# Patient Record
Sex: Female | Born: 1995 | Race: White | Hispanic: No | Smoking: Never smoker
Health system: Southern US, Community
[De-identification: ages and names within clinical notes are randomized; demographics above are authoritative.]

## PROBLEM LIST (undated history)

## (undated) DIAGNOSIS — N289 Disorder of kidney and ureter, unspecified: Secondary | ICD-10-CM

## (undated) HISTORY — DX: Disorder of kidney and ureter, unspecified: N28.9

---
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
## 2015-10-01 ENCOUNTER — Encounter: Payer: Self-pay | Admitting: Obstetrics and Gynecology

## 2015-10-01 ENCOUNTER — Ambulatory Visit (INDEPENDENT_AMBULATORY_CARE_PROVIDER_SITE_OTHER): Payer: Managed Care, Other (non HMO) | Admitting: Obstetrics and Gynecology

## 2015-10-01 VITALS — BP 124/80 | HR 82 | Ht 64.0 in | Wt 128.0 lb

## 2015-10-01 DIAGNOSIS — N94819 Vulvodynia, unspecified: Secondary | ICD-10-CM | POA: Diagnosis not present

## 2015-10-01 DIAGNOSIS — N941 Unspecified dyspareunia: Secondary | ICD-10-CM | POA: Diagnosis not present

## 2015-10-01 MED ORDER — LIDOCAINE 5 % EX OINT: TOPICAL_OINTMENT | CUTANEOUS | Status: DC

## 2015-10-01 NOTE — Progress Notes (Signed)
20 y.o. G0P0000 SingleCaucasianF here for a consultation from Valora Piccolo, PA at [facility] for painful vaginal lesions x 3-4 weeks. She was treated with steroid ointment for a week, no help. Initially was hurting when she wiped. She was tested for HSV, negative culture. Only currently tender to touch. Prior provider told her it didn't look like HSV.  She is sexually active, but not since this started. Partner without symptoms. 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 She isn't using a lubricant. The patient was checked for STD's and had an annual exam in 12/16. Lab work was negative.  No fevers, no eye or mouth ulcerations.  Period Cycle (Days): 30 Period Duration (Days): 5 Period Pattern: Regular Menstrual Flow: Moderate, Light Menstrual Control: Maxi pad Menstrual Control Change Freq (Hours): 4 to 5 Dysmenorrhea: (!) Moderate Dysmenorrhea Symptoms: Cramping  Patient's last menstrual period was 09/16/2015.          Sexually active: Yes.    The current method of family planning is OCP (estrogen/progesterone).    Exercising: Yes.    Gym/ health club routine includes cardio. Smoker:  no  Health Maintenance: Pap:  07/10/2015 History of abnormal Pap:  no MMG:  N/a  Colonoscopy:  n/a TDaP:  No sure of last date  Gardasil: completed series in 2015   reports that she has never smoked. She does not have any smokeless tobacco history on file. She reports that she does not drink alcohol or use illicit drugs.  Past Medical History  Diagnosis Date  . Kidney disease 2001    Past Surgical History  Procedure Laterality Date  . Tonsillectomy  2004  . Wisdom tooth extraction  2011  . Birth mark removal  2000    Current Outpatient Prescriptions  Medication Sig Dispense Refill  . enalapril (VASOTEC) 20 MG tablet Take 20 mg by mouth daily.    . norethindrone-ethinyl estradiol (MICROGESTIN) 1-20 MG-MCG tablet Take 1 tablet by mouth daily.     No  current facility-administered medications for this visit.    Family History  Problem Relation Age of Onset  . Hypertension Father   . Diabetes Father     Review of Systems  Exam:   BP 124/80 mmHg  Pulse 82  Ht  (1.626 m)  Wt 128 lb (58.06 kg)  BMI 21.96 kg/m2  LMP 09/16/2015  Weight change: @ Height:   Height:  (162.6 cm)  Ht Readings from Last 3 Encounters:  10/01/15  (1.626 m)    General appearance: alert, cooperative and appears stated age   Pelvic: External genitalia:  no lesions, inside of the labia minora, just distal to the vestibule, she has multiple vulvar papillae (normal anatomy). She is very tender to palpation of the vestibule in all 4 quadrants.               Urethra:  normal appearing urethra with no masses, tenderness or lesions              Bartholins and Skenes: normal                 Vagina: normal appearing vagina with normal color and discharge, no lesions              Cervix: no lesions               Bimanual Exam:  Uterus:  normal size, contour, position, consistency, mobility, non-tender  Adnexa: no mass, fullness, tenderness  Pelvic floor: not tender                Chaperone was present for exam.  Wet prep: no clue, no trich, + wbc KOH: no yeast PH: 4   A:  Vulvodynia  Dyspareunia  P:   Affirm sent  Information on vulvar pain given (Up To Date)  Discussed vulvar skin care  Discussed avoiding caffeine, chocolate, acidic food  Will call in compounded cream to apply to the vestibule  Lidocaine ointment to use prior to intercourse  Use a lubricant with intercourse, patient to control rate and depth of penetration  F/U in 1 month    CC: Valora PiccoloMatthew Strupp, PA Letter sent

## 2015-10-02 LAB — WET PREP BY MOLECULAR PROBE
CANDIDA SPECIES: NEGATIVE
GARDNERELLA VAGINALIS: NEGATIVE
TRICHOMONAS VAG: NEGATIVE

## 2015-10-02 MED ORDER — NONFORMULARY OR COMPOUNDED ITEM: Status: DC

## 2015-10-06 ENCOUNTER — Telehealth: Payer: Self-pay

## 2015-10-06 MED ORDER — NONFORMULARY OR COMPOUNDED ITEM: Status: DC

## 2015-10-06 NOTE — Telephone Encounter (Signed)
Spoke with patient. Advised [facility] that she has been experiencing burning shortly after using the compounded cream which contains Gabapentin 6%, Ketoprofen 10% and Ketamine 10% in a neutral base. Advised I have reviewed this with Dr.Jertson and she would like to send in a new rx for Gabapentin 6%, Ketoprofen 10 % in a neutral base apply a pea sized amount TID. Advised this will remove the Ketamine which may be causing the burning. Requested that the pharmacy only fill a small amount so that she may try this new rx and see how she does. She is agreeable and will provide an update on how she is doing after she has used the cream for 2 days.  Dr.Jerston, do you agree with recommendations for this patient?

## 2015-10-07 NOTE — Telephone Encounter (Signed)
Yes, thank you.

## 2015-11-05 ENCOUNTER — Ambulatory Visit (INDEPENDENT_AMBULATORY_CARE_PROVIDER_SITE_OTHER): Payer: Managed Care, Other (non HMO) | Admitting: Obstetrics and Gynecology

## 2015-11-05 ENCOUNTER — Encounter: Payer: Self-pay | Admitting: Obstetrics and Gynecology

## 2015-11-05 VITALS — BP 110/60 | HR 60 | Resp 14 | Wt 123.0 lb

## 2015-11-05 DIAGNOSIS — N94819 Vulvodynia, unspecified: Secondary | ICD-10-CM | POA: Diagnosis not present

## 2015-11-05 NOTE — Progress Notes (Signed)
Patient ID: Danielle Ponce, female   DOB: [birth year], 20 y.o.   MRN: [id number] GYNECOLOGY  VISIT   HPI: 20 y.o.   Single  Caucasian  female   G0P0000 with Patient's last menstrual period was 10/15/2015.   here for follow up of Vulvodynia /Dyspareunia. Last month she was started on a topical compounded cream, using it 3 x a day. She is feeling better. No longer tender to touch. She hasn't been sexually active since her last visit.   GYNECOLOGIC HISTORY: Patient's last menstrual period was 10/15/2015. Contraception:OCP Menopausal hormone therapy: none         OB History    Gravida Para Term Preterm AB TAB SAB Ectopic Multiple Living           There are no active problems to display for this patient.   Past Medical History  Diagnosis Date  . Kidney disease 2001    Past Surgical History  Procedure Laterality Date  . Tonsillectomy  2004  . Wisdom tooth extraction  2011  . Birth mark removal  2000    Current Outpatient Prescriptions  Medication Sig Dispense Refill  . enalapril (VASOTEC) 20 MG tablet Take 20 mg by mouth daily.    Marland Kitchen lidocaine (XYLOCAINE) 5 % ointment Apply a small amount topically 20 minutes prior to intercourse, wipe off just prior to intercourse. 30 g 1  . NONFORMULARY OR COMPOUNDED ITEM Gabapentin 6%, ketoprofen 10% in a neutral base. Apply a pea sized amount topically TID. 1 each 1  . norethindrone-ethinyl estradiol (MICROGESTIN) 1-20 MG-MCG tablet Take 1 tablet by mouth daily.     No current facility-administered medications for this visit.     ALLERGIES: Review of patient's allergies indicates no known allergies.  Family History  Problem Relation Age of Onset  . Hypertension Father   . Diabetes Father     Social History   Social History  . Marital Status: Single    Spouse Name: N/A  . Number of Children: N/A  . Years of Education: N/A   Occupational History  . Not on file.   Social History Main Topics  . Smoking  status: Never Smoker   . Smokeless tobacco: Never Used  . Alcohol Use: No  . Drug Use: No  . Sexual Activity:    Partners: Male   Other Topics Concern  . Not on file   Social History Narrative    Review of Systems  Constitutional: Negative.   HENT: Negative.   Eyes: Negative.   Respiratory: Negative.   Cardiovascular: Negative.   Gastrointestinal: Negative.   Genitourinary: Negative.   Musculoskeletal: Negative.   Skin: Negative.   Neurological: Negative.   Endo/Heme/Allergies: Negative.   Psychiatric/Behavioral: Negative.     PHYSICAL EXAMINATION:    BP 110/60 mmHg  Pulse 60  Resp 14  Wt 123 lb (55.792 kg)  LMP 10/15/2015    General appearance: alert, cooperative and appears stated age  Pelvic: External genitalia:  no lesions, she is tender to palpation to the vestibule at 2 and 10 o'clock, not tender posteriorly. She rates the tenderness as a 5/10 in severity, down from a 10/10              Urethra:  normal appearing urethra with no masses, tenderness or lesions              Bartholins and Skenes: normal  Chaperone was present for exam.  ASSESSMENT Vulvodynia, improving She hasn't been sexually active since starting treatment    PLAN Continue with the gabapentin/ketamine/ketoprofen cream qhs, and up to tid prn. She has lidocaine to use if sexually active Call if symptoms don't continue to improve    An After Visit Summary was printed and given to the patient.

## 2016-12-01 ENCOUNTER — Ambulatory Visit (HOSPITAL_COMMUNITY)
Admission: EM | Admit: 2016-12-01 | Discharge: 2016-12-01 | Disposition: A | Discharge status: Home / Self Care | Payer: Commercial Managed Care - PPO | Attending: Family Medicine | Admitting: Family Medicine

## 2016-12-01 ENCOUNTER — Encounter (HOSPITAL_COMMUNITY): Payer: Self-pay | Admitting: Family Medicine

## 2016-12-01 DIAGNOSIS — K529 Noninfective gastroenteritis and colitis, unspecified: Secondary | ICD-10-CM

## 2016-12-01 DIAGNOSIS — R110 Nausea: Secondary | ICD-10-CM

## 2016-12-01 MED ORDER — ONDANSETRON 4 MG PO TBDP: 4.0000 mg | ORAL_TABLET | Freq: Three times a day (TID) | ORAL | 0 refills | Status: DC | PRN

## 2016-12-01 MED ORDER — ONDANSETRON 4 MG PO TBDP: 4.0000 mg | ORAL_TABLET | Freq: Once | ORAL | Status: DC

## 2016-12-01 MED ORDER — ONDANSETRON 4 MG PO TBDP
4.0000 mg | ORAL_TABLET | Freq: Once | ORAL | Status: AC
  Administered 2016-12-01: 4 mg via ORAL

## 2016-12-01 MED ORDER — ONDANSETRON 4 MG PO TBDP
ORAL_TABLET | ORAL | Status: AC
  Filled 2016-12-01: qty 1

## 2016-12-01 MED ORDER — HYOSCYAMINE SULFATE SL 0.125 MG SL SUBL: SUBLINGUAL_TABLET | SUBLINGUAL | 0 refills | Status: DC

## 2016-12-01 NOTE — ED Triage Notes (Signed)
Pt sts that she woke up this am with abd cramping, diarrhea and vomiting. sts that the cramping goes into back. sts that she has kidney issues and takes Enalapril

## 2016-12-01 NOTE — ED Provider Notes (Signed)
CSN: [id number]     Arrival date & time 12/01/16  1713 History   First MD Initiated Contact with Patient 12/01/16 1824     Chief Complaint  Patient presents with  . Abdominal Pain  . Diarrhea  . Emesis   (Consider location/radiation/quality/duration/timing/severity/associated sxs/prior Treatment) Patient c/o abdominal cramping and diarrhea   The history is provided by the patient.  Abdominal Pain  Pain location:  Generalized Pain quality: aching and cramping   Pain radiates to:  Does not radiate Pain severity:  Moderate Onset quality:  Sudden Duration:  1 day Timing:  Constant Associated symptoms: diarrhea, fatigue and vomiting   Diarrhea  Associated symptoms: abdominal pain and vomiting   Emesis  Associated symptoms: abdominal pain and diarrhea     Past Medical History:  Diagnosis Date  . Kidney disease 2001   Past Surgical History:  Procedure Laterality Date  . birth mark removal  2000  . TONSILLECTOMY  2004  . WISDOM TOOTH EXTRACTION  2011   Family History  Problem Relation Age of Onset  . Hypertension Father   . Diabetes Father    Social History  Substance Use Topics  . Smoking status: Never Smoker  . Smokeless tobacco: Never Used  . Alcohol use No   OB History    Gravida Para Term Preterm AB Living   0 0 0 0 0 0   SAB TAB Ectopic Multiple Live Births   0 0 0 0       Review of Systems  Constitutional: Positive for fatigue.  HENT: Negative.   Eyes: Negative.   Respiratory: Negative.   Cardiovascular: Negative.   Gastrointestinal: Positive for abdominal pain, diarrhea and vomiting.  Endocrine: Negative.   Genitourinary: Negative.   Musculoskeletal: Negative.   Allergic/Immunologic: Negative.   Neurological: Negative.   Hematological: Negative.   Psychiatric/Behavioral: Negative.     Allergies  Patient has no known allergies.  Home Medications   Prior to Admission medications   Medication Sig Start Date End Date Taking? Authorizing  Provider  enalapril (VASOTEC) 20 MG tablet Take 20 mg by mouth daily.    [provider]  Hyoscyamine Sulfate SL (LEVSIN/SL) 0.125 MG SUBL Take one po q 4 hours prn abdominal pain 12/01/16   Deatra Canter, FNP  lidocaine (XYLOCAINE) 5 % ointment Apply a small amount topically 20 minutes prior to intercourse, wipe off just prior to intercourse. 10/01/15   Romualdo Bolk, MD  NONFORMULARY OR COMPOUNDED ITEM Gabapentin 6%, ketoprofen 10% in a neutral base. Apply a pea sized amount topically TID. 10/06/15   Romualdo Bolk, MD  norethindrone-ethinyl estradiol (MICROGESTIN) 1-20 MG-MCG tablet Take 1 tablet by mouth daily.    [provider]  ondansetron (ZOFRAN ODT) 4 MG disintegrating tablet Take 1 tablet (4 mg total) by mouth every 8 (eight) hours as needed for nausea or vomiting. 12/01/16   Deatra Canter, FNP   Meds Ordered and Administered this Visit   Medications  ondansetron (ZOFRAN-ODT) disintegrating tablet 4 mg (4 mg Oral Given 12/01/16 1839)    BP 140/76   Pulse (!) 54   Temp 98.3 F (36.8 C)   Resp 18   LMP 11/17/2016   SpO2 96%  No data found.   Physical Exam  Constitutional: She is oriented to person, place, and time. She appears well-developed and well-nourished.  HENT:  Head: Normocephalic and atraumatic.  Eyes: Conjunctivae and EOM are normal. Pupils are equal, round, and reactive to light.  Neck: Normal  range of motion. Neck supple.  Cardiovascular: Normal rate, regular rhythm and normal heart sounds.   Pulmonary/Chest: Effort normal and breath sounds normal.  Abdominal: Soft. Bowel sounds are normal.  Neurological: She is alert and oriented to person, place, and time.  Nursing note and vitals reviewed.   Urgent Care Course     Procedures (including critical care time)  Labs Review Labs Reviewed - No data to display  Imaging Review No results found.   Visual Acuity Review  Right Eye Distance:   Left Eye Distance:    Bilateral Distance:    Right Eye Near:   Left Eye Near:    Bilateral Near:         MDM   1. Gastroenteritis   2. Nausea    Zofran ODT 4mg  now Zofran ODT 4mg  one po tid prn #21 Levsin 0.125mg  one po q 4 hours prn #21  Push po fluids, rest, tylenol and motrin otc prn as directed for fever, arthralgias, and myalgias.  Follow up prn if sx's continue or persist.    Deatra CanterOxford, Chuck Caban J, FNP 12/01/16 [phone]

## 2017-07-25 DIAGNOSIS — Z6820 Body mass index (BMI) 20.0-20.9, adult: Secondary | ICD-10-CM | POA: Diagnosis not present

## 2017-07-25 DIAGNOSIS — Z01419 Encounter for gynecological examination (general) (routine) without abnormal findings: Secondary | ICD-10-CM | POA: Diagnosis not present

## 2017-07-25 DIAGNOSIS — Z118 Encounter for screening for other infectious and parasitic diseases: Secondary | ICD-10-CM | POA: Diagnosis not present

## 2017-12-15 DIAGNOSIS — N941 Unspecified dyspareunia: Secondary | ICD-10-CM | POA: Diagnosis not present

## 2017-12-15 DIAGNOSIS — N898 Other specified noninflammatory disorders of vagina: Secondary | ICD-10-CM | POA: Diagnosis not present

## 2017-12-15 DIAGNOSIS — Z113 Encounter for screening for infections with a predominantly sexual mode of transmission: Secondary | ICD-10-CM | POA: Diagnosis not present

## 2017-12-15 DIAGNOSIS — Z124 Encounter for screening for malignant neoplasm of cervix: Secondary | ICD-10-CM | POA: Diagnosis not present

## 2017-12-15 DIAGNOSIS — Z01411 Encounter for gynecological examination (general) (routine) with abnormal findings: Secondary | ICD-10-CM | POA: Diagnosis not present

## 2017-12-15 DIAGNOSIS — Z681 Body mass index (BMI) 19 or less, adult: Secondary | ICD-10-CM | POA: Diagnosis not present

## 2018-02-21 DIAGNOSIS — I129 Hypertensive chronic kidney disease with stage 1 through stage 4 chronic kidney disease, or unspecified chronic kidney disease: Secondary | ICD-10-CM | POA: Diagnosis not present

## 2018-02-21 DIAGNOSIS — N184 Chronic kidney disease, stage 4 (severe): Secondary | ICD-10-CM | POA: Diagnosis not present

## 2018-02-21 DIAGNOSIS — R809 Proteinuria, unspecified: Secondary | ICD-10-CM | POA: Diagnosis not present

## 2018-03-15 DIAGNOSIS — T1591XA Foreign body on external eye, part unspecified, right eye, initial encounter: Secondary | ICD-10-CM | POA: Diagnosis not present

## 2018-03-15 DIAGNOSIS — S0501XA Injury of conjunctiva and corneal abrasion without foreign body, right eye, initial encounter: Secondary | ICD-10-CM | POA: Diagnosis not present

## 2018-03-17 DIAGNOSIS — L42 Pityriasis rosea: Secondary | ICD-10-CM | POA: Diagnosis not present

## 2018-03-31 DIAGNOSIS — N9411 Superficial (introital) dyspareunia: Secondary | ICD-10-CM | POA: Diagnosis not present

## 2018-03-31 DIAGNOSIS — N94810 Vulvar vestibulitis: Secondary | ICD-10-CM | POA: Diagnosis not present

## 2018-05-01 ENCOUNTER — Ambulatory Visit: Payer: BLUE CROSS/BLUE SHIELD | Admitting: Physician Assistant

## 2018-05-01 ENCOUNTER — Encounter: Payer: Self-pay | Admitting: Physician Assistant

## 2018-05-01 ENCOUNTER — Other Ambulatory Visit: Payer: Self-pay

## 2018-05-01 VITALS — BP 117/73 | HR 52 | Temp 98.4°F | Resp 18 | Ht 64.57 in | Wt 115.6 lb

## 2018-05-01 DIAGNOSIS — R6889 Other general symptoms and signs: Secondary | ICD-10-CM | POA: Diagnosis not present

## 2018-05-01 DIAGNOSIS — N760 Acute vaginitis: Secondary | ICD-10-CM | POA: Diagnosis not present

## 2018-05-01 DIAGNOSIS — Z23 Encounter for immunization: Secondary | ICD-10-CM | POA: Diagnosis not present

## 2018-05-01 DIAGNOSIS — N189 Chronic kidney disease, unspecified: Secondary | ICD-10-CM | POA: Diagnosis not present

## 2018-05-01 DIAGNOSIS — F411 Generalized anxiety disorder: Secondary | ICD-10-CM

## 2018-05-01 NOTE — Progress Notes (Deleted)
Danielle Ponce  MRN: [id number] DOB: 1996-01-10  Subjective:  @NAMEBYAGE @ is a 22 y.o. female who presents for annual physical exam and ***.  Last dental exam:  Last vision exam: Last pap smear: Last mammogram: Last colonoscopy: Vaccinations      Tetanus      HPV      Zostavax  Primary Preventative Screenings: Cervical Cancer: Family Planning: STI screening:  Breast Cancer:  Colorectal Cancer:  Tobacco use/EtOH/substances:  Bone Density:  Cardiac:  Weight/Blood sugar/Diet/Exercise: OTC/Vit/Supp/Herbal: Dentist/Optho:  Immunizations:   There are no active problems to display for this patient.   Current Outpatient Medications on File Prior to Visit  Medication Sig Dispense Refill  . enalapril (VASOTEC) 20 MG tablet Take 20 mg by mouth daily.    Marland Kitchen Hyoscyamine Sulfate SL (LEVSIN/SL) 0.125 MG SUBL Take one po q 4 hours prn abdominal pain 30 each 0  . lidocaine (XYLOCAINE) 5 % ointment Apply a small amount topically 20 minutes prior to intercourse, wipe off just prior to intercourse. 30 g 1  . NONFORMULARY OR COMPOUNDED ITEM Gabapentin 6%, ketoprofen 10% in a neutral base. Apply a pea sized amount topically TID. 1 each 1  . norethindrone-ethinyl estradiol (MICROGESTIN) 1-20 MG-MCG tablet Take 1 tablet by mouth daily.    . ondansetron (ZOFRAN ODT) 4 MG disintegrating tablet Take 1 tablet (4 mg total) by mouth every 8 (eight) hours as needed for nausea or vomiting. 20 tablet 0   No current facility-administered medications on file prior to visit.     No Known Allergies  Social History   Socioeconomic History  . Marital status: Single    Spouse name: Not on file  . Number of children: Not on file  . Years of education: Not on file  . Highest education level: Not on file  Occupational History  . Not on file  Social Needs  . Financial resource strain: Not on file  . Food insecurity:    Worry: Not on file    Inability: Not on file  . Transportation needs:   Medical: Not on file    Non-medical: Not on file  Tobacco Use  . Smoking status: Never Smoker  . Smokeless tobacco: Never Used  Substance and Sexual Activity  . Alcohol use: No    Alcohol/week: 0.0 standard drinks  . Drug use: No  . Sexual activity: Yes    Partners: Male  Lifestyle  . Physical activity:    Days per week: Not on file    Minutes per session: Not on file  . Stress: Not on file  Relationships  . Social connections:    Talks on phone: Not on file    Gets together: Not on file    Attends religious service: Not on file    Active member of club or organization: Not on file    Attends meetings of clubs or organizations: Not on file    Relationship status: Not on file  Other Topics Concern  . Not on file  Social History Narrative  . Not on file    Past Surgical History:  Procedure Laterality Date  . birth mark removal  2000  . TONSILLECTOMY  2004  . WISDOM TOOTH EXTRACTION  2011    Family History  Problem Relation Age of Onset  . Hypertension Father   . Diabetes Father     Review of Systems  Objective:  There were no vitals taken for this visit.  Physical Exam No exam data present  Assessment  and Plan :  Discussed healthy lifestyle, diet, exercise, preventative care, vaccinations, and addressed patient's concerns. Plan for follow up in ***. Otherwise, plan for specific conditions below.  There are no diagnoses linked to this encounter.  Benjiman Core PA-C  [facility] 05/01/2018 7:18 AM

## 2018-05-01 NOTE — Patient Instructions (Addendum)
Once we have your lab results, we will be able to give you a prescription for a medication for anxiety. I also suggest looking into therapy. After starting medication, please follow up with me in 4 weeks.   For therapy -- [facility] ([address] [place] [place]) - [phone] [facility]) - [phone] [facility] - [phone] [facility] - [phone] [facility] - [phone] [facility]  - [phone] (do not file insurance)     If you have lab work done today you will be contacted with your lab results within the next 2 weeks.  If you have not heard from [place] then please contact us. The fastest way to get your results is to register for My Chart.   Living With Anxiety After being diagnosed with an anxiety disorder, you may be relieved to know why you have felt or behaved a certain way. It is natural to also feel overwhelmed about the treatment ahead and what it will mean for your life. With care and support, you can manage this condition and recover from it. How to cope with anxiety Dealing with stress Stress is your body's reaction to life changes and events, both good and bad. Stress can last just a few hours or it can be ongoing. Stress can play a major role in anxiety, so it is important to learn both how to cope with stress and how to think about it differently. Talk with your health care provider or a counselor to learn more about stress reduction. He or she may suggest some stress reduction techniques, such as:  Music therapy. This can include creating or listening to music that you enjoy and that inspires you.  Mindfulness-based meditation. This involves being aware of your normal breaths, rather than trying to control your breathing. It can be done while sitting or walking.  Centering prayer. This is a kind of meditation that  involves focusing on a word, phrase, or sacred image that is meaningful to you and that brings you peace.  Deep breathing. To do this, expand your stomach and inhale slowly through your nose. Hold your breath for 3-5 seconds. Then exhale slowly, allowing your stomach muscles to relax.  Self-talk. This is a skill where you identify thought patterns that lead to anxiety reactions and correct those thoughts.  Muscle relaxation. This involves tensing muscles then relaxing them.  Choose a stress reduction technique that fits your lifestyle and personality. Stress reduction techniques take time and practice. Set aside 5-15 minutes a day to do them. Therapists can offer training in these techniques. The training may be covered by some insurance plans. Other things you can do to manage stress include:  Keeping a stress diary. This can help you learn what triggers your stress and ways to control your response.  Thinking about how you respond to certain situations. You may not be able to control everything, but you can control your reaction.  Making time for activities that help you relax, and not feeling guilty about spending your time in this way.  Therapy combined with coping and stress-reduction skills provides the best chance for successful treatment. Medicines Medicines can help ease symptoms. Medicines for anxiety include:  Anti-anxiety drugs.  Antidepressants.  Beta-blockers.  Medicines may be used as the main treatment for anxiety disorder, along with therapy, or if other treatments are not working. Medicines should be prescribed by a health care provider. Relationships Relationships  can play a big part in helping you recover. Try to spend more time connecting with trusted friends and family members. Consider going to couples counseling, taking family education classes, or going to family therapy. Therapy can help you and others better understand the condition. How to recognize changes in  your condition Everyone has a different response to treatment for anxiety. Recovery from anxiety happens when symptoms decrease and stop interfering with your daily activities at home or work. This may mean that you will start to:  Have better concentration and focus.  Sleep better.  Be less irritable.  Have more energy.  Have improved memory.  It is important to recognize when your condition is getting worse. Contact your health care provider if your symptoms interfere with home or work and you do not feel like your condition is improving. Where to find help and support: You can get help and support from these sources:  Self-help groups.  Online and [employer].  A trusted spiritual leader.  Couples counseling.  Family education classes.  Family therapy.  Follow these instructions at home:  Eat a healthy diet that includes plenty of vegetables, fruits, whole grains, low-fat dairy products, and lean protein. Do not eat a lot of foods that are high in solid fats, added sugars, or salt.  Exercise. Most adults should do the following: ? Exercise for at least 150 minutes each week. The exercise should increase your heart rate and make you sweat (moderate-intensity exercise). ? Strengthening exercises at least twice a week.  Cut down on caffeine, tobacco, alcohol, and other potentially harmful substances.  Get the right amount and quality of sleep. Most adults need 7-9 hours of sleep each night.  Make choices that simplify your life.  Take over-the-counter and prescription medicines only as told by your health care provider.  Avoid caffeine, alcohol, and certain over-the-counter cold medicines. These may make you feel worse. Ask your pharmacist which medicines to avoid.  Keep all follow-up visits as told by your health care provider. This is important. Questions to ask your health care provider  Would I benefit from therapy?  How often should I follow up  with a health care provider?  How long do I need to take medicine?  Are there any long-term side effects of my medicine?  Are there any alternatives to taking medicine? Contact a health care provider if:  You have a hard time staying focused or finishing daily tasks.  You spend many hours a day feeling worried about everyday life.  You become exhausted by worry.  You start to have headaches, feel tense, or have nausea.  You urinate more than normal.  You have diarrhea. Get help right away if:  You have a racing heart and shortness of breath.  You have thoughts of hurting yourself or others. If you ever feel like you may hurt yourself or others, or have thoughts about taking your own life, get help right away. You can go to your nearest emergency department or call:  Your local emergency services (911 in the [place]  A suicide crisis helpline, such as the National Suicide Prevention Lifeline at 914-839-9312. This is open 24-hours a day.  Summary  Taking steps to deal with stress can help calm you.  Medicines cannot cure anxiety disorders, but they can help ease symptoms.  Family, friends, and partners can play a big part in helping you recover from an anxiety disorder. This information is not intended to replace advice given to you  by your health care provider. Make sure you discuss any questions you have with your health care provider. Document Released: 06/29/2016 Document Revised: 06/29/2016 Document Reviewed: 06/29/2016 Elsevier Interactive Patient Education  2018 [employer].  IF you received an x-ray today, you will receive an invoice from [facility]. Please contact [facility] at [phone] with questions or concerns regarding your invoice.   IF you received labwork today, you will receive an invoice from [facility]. Please contact [facility] at [phone] with questions or concerns regarding your invoice.   Our billing staff will not be able  to assist you with questions regarding bills from these companies.  You will be contacted with the lab results as soon as they are available. The fastest way to get your results is to activate your My Chart account. Instructions are located on the last page of this paperwork. If you have not heard from [place] regarding the results in 2 weeks, please contact this office.

## 2018-05-01 NOTE — Progress Notes (Signed)
Danielle Ponce  MRN: [id number] DOB: [birth year]  Subjective:  Danielle Ponce is a 22 y.o. female seen in office today for a chief complaint of establish care and for anxiety. She moved here from [place] ~4 years for school. Just finished Master's degree in social work. 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 She is also studying for LCSW exam in 06/2018, has failed it once. Needs to pass it. Stressors: job, Pensions consultant, kind of boyfriend.   Has had anxiety since she started college but has been able to tolerate it until now. For the past 6 months, has had constant worrying and cannot get anything done. Very irritable and will often cry due to feeling overwhelmed. Not getting much sleep due to anxiety, cannot concentrate, decreased sexual drive. Denies dysphoric mood, hallucinations, grandiose thoughts, mania, decreased need for sleep, impulsive behavior. Denies SI and HI.   No PMH of seizure disorder. FH of anxiety in sister, takes celexa.   Denies tobacco, alcohol, and ilicit drug use.   PMH of CKD, cannot remember what stage. Followed by Danielle Ponce for CKD, dx at age 54-6. Sees them every 6 months. Takes enalzpril 10 mg daily.  Takes OCPs daily. Sexually active with monogamous partner. LMP 04/25/18. Cycles are regular.    Review of Systems  Constitutional: Negative for chills, fatigue, fever and unexpected weight change.  Respiratory: Negative for cough and shortness of breath.   Cardiovascular: Negative for chest pain, palpitations and leg swelling.  Gastrointestinal: Negative for abdominal pain, nausea and vomiting.  Skin: Negative for rash.  Neurological: Negative for dizziness and numbness.  Psychiatric/Behavioral: Positive for decreased concentration and sleep disturbance. Negative for agitation, behavioral problems, confusion, dysphoric mood, hallucinations, self-injury and suicidal ideas. The patient is nervous/anxious. The  patient is not hyperactive.     Patient Active Problem List   Diagnosis Date Noted  . Chronic kidney disease (CKD), stage II (mild) 01/22/2012  . Proteinuria 01/22/2012  . Reflux nephropathy 01/22/2012    Current Outpatient Medications on File Prior to Visit  Medication Sig Dispense Refill  . enalapril (VASOTEC) 20 MG tablet Take 20 mg by mouth daily.    . norethindrone-ethinyl estradiol (MICROGESTIN) 1-20 MG-MCG tablet Take 1 tablet by mouth daily.    Marland Kitchen Hyoscyamine Sulfate SL (LEVSIN/SL) 0.125 MG SUBL Take one po q 4 hours prn abdominal pain (Patient not taking: Reported on 05/01/2018) 30 each 0  . lidocaine (XYLOCAINE) 5 % ointment Apply a small amount topically 20 minutes prior to intercourse, wipe off just prior to intercourse. (Patient not taking: Reported on 05/01/2018) 30 g 1  . NONFORMULARY OR COMPOUNDED ITEM Gabapentin 6%, ketoprofen 10% in a neutral base. Apply a pea sized amount topically TID. (Patient not taking: Reported on 05/01/2018) 1 each 1  . ondansetron (ZOFRAN ODT) 4 MG disintegrating tablet Take 1 tablet (4 mg total) by mouth every 8 (eight) hours as needed for nausea or vomiting. (Patient not taking: Reported on 05/01/2018) 20 tablet 0   No current facility-administered medications on file prior to visit.     No Known Allergies   Objective:  BP 117/73   Pulse (!) 52   Temp 98.4 F (36.9 C) (Oral)   Resp 18   Ht 5' 4.57" (1.64 m)   Wt 115 lb 9.6 oz (52.4 kg)   LMP 04/25/2018   SpO2 100%   BMI 19.50 kg/m   Physical Exam  Constitutional: She is oriented to person, place, and  time. She appears well-developed and well-nourished. No distress.  HENT:  Head: Normocephalic and atraumatic.  Eyes: Conjunctivae are normal.  Neck: Normal range of motion.  Cardiovascular: Normal rate, regular rhythm, normal heart sounds and intact distal pulses.  Pulmonary/Chest: Effort normal.  Neurological: She is alert and oriented to person, place, and time.  Skin: Skin is  warm and dry.  Psychiatric: Her mood appears anxious.  Vitals reviewed.    GAD 7 : Generalized Anxiety Score 05/01/2018  Nervous, Anxious, on Edge 3  Control/stop worrying 2  Worry too much - different things 2  Trouble relaxing 2  Restless 1  Easily annoyed or irritable 1  Afraid - awful might happen 1  Total GAD 7 Score 12  Anxiety Difficulty Not difficult at all    Depression screen [facility] 2/9 05/01/2018  Decreased Interest 0  Down, Depressed, Hopeless 0  PHQ - 2 Score 0   Negative screen for bipolar discorder via Mood Questionnaire.  Assessment and Plan :  1. Generalized anxiety disorder Hx consistent with moderate GAD. Neg Mood Questionnaire and PHQ 9 screen. No SI or HI. Labs pending. Has hx of CKD, unsure of what stage. Pt would likely benefit from both medication and therapy. Awaiting labs to determine if safe for pt to be on SSRI. Given contact info for local therapists to reach out to. F/u with me 4 weeks after starting therapy.  - CBC with Differential/Platelet - CMP14+EGFR - TSH - Vitamin B12  2. Chronic kidney disease, unspecified CKD stage - CMP14+EGFR  3. Flu vaccine need - Flu Vaccine QUAD 36+ mos IM  A total of 30 was spent in the room with the patient, greater than 50% of which was in counseling/coordination of care regarding anxiety.   Tenna Delaine PA-C  [facility] 05/01/2018 10:31 AM

## 2018-05-02 DIAGNOSIS — N94819 Vulvodynia, unspecified: Secondary | ICD-10-CM | POA: Diagnosis not present

## 2018-05-02 LAB — CMP14+EGFR
A/G RATIO: 1.6 (ref 1.2–2.2)
ALBUMIN: 4.6 g/dL (ref 3.5–5.5)
ALT: 36 IU/L — ABNORMAL HIGH (ref 0–32)
AST: 30 IU/L (ref 0–40)
Alkaline Phosphatase: 86 IU/L (ref 39–117)
BILIRUBIN TOTAL: 0.2 mg/dL (ref 0.0–1.2)
BUN/Creatinine Ratio: 18 (ref 9–23)
BUN: 32 mg/dL — AB (ref 6–20)
CHLORIDE: 104 mmol/L (ref 96–106)
CO2: 19 mmol/L — ABNORMAL LOW (ref 20–29)
Calcium: 10.3 mg/dL — ABNORMAL HIGH (ref 8.7–10.2)
Creatinine, Ser: 1.73 mg/dL — ABNORMAL HIGH (ref 0.57–1.00)
GFR calc non Af Amer: 41 mL/min/{1.73_m2} — ABNORMAL LOW (ref >59)
GFR, EST AFRICAN AMERICAN: 47 mL/min/{1.73_m2} — AB (ref >59)
Globulin, Total: 2.8 g/dL (ref 1.5–4.5)
Glucose: 69 mg/dL (ref 65–99)
POTASSIUM: 4.3 mmol/L (ref 3.5–5.2)
Sodium: 143 mmol/L (ref 134–144)
TOTAL PROTEIN: 7.4 g/dL (ref 6.0–8.5)

## 2018-05-02 LAB — CBC WITH DIFFERENTIAL/PLATELET
BASOS: 1 %
Basophils Absolute: 0.1 10*3/uL (ref 0.0–0.2)
EOS (ABSOLUTE): 0.1 10*3/uL (ref 0.0–0.4)
Eos: 1 %
HEMOGLOBIN: 13.3 g/dL (ref 11.1–15.9)
Hematocrit: 39.2 % (ref 34.0–46.6)
IMMATURE GRANS (ABS): 0.1 10*3/uL (ref 0.0–0.1)
Immature Granulocytes: 1 %
LYMPHS: 34 %
Lymphocytes Absolute: 3.4 10*3/uL — ABNORMAL HIGH (ref 0.7–3.1)
MCH: 32.1 pg (ref 26.6–33.0)
MCHC: 33.9 g/dL (ref 31.5–35.7)
MCV: 95 fL (ref 79–97)
MONOCYTES: 5 %
Monocytes Absolute: 0.5 10*3/uL (ref 0.1–0.9)
NEUTROS ABS: 5.7 10*3/uL (ref 1.4–7.0)
Neutrophils: 58 %
Platelets: 265 10*3/uL (ref 150–450)
RBC: 4.14 x10E6/uL (ref 3.77–5.28)
RDW: 12.1 % — ABNORMAL LOW (ref 12.3–15.4)
WBC: 9.9 10*3/uL (ref 3.4–10.8)

## 2018-05-02 LAB — VITAMIN B12: Vitamin B-12: 664 pg/mL (ref 232–1245)

## 2018-05-02 LAB — TSH: TSH: 1.57 u[IU]/mL (ref 0.450–4.500)

## 2018-05-04 ENCOUNTER — Other Ambulatory Visit: Payer: Self-pay | Admitting: Physician Assistant

## 2018-05-04 MED ORDER — ESCITALOPRAM OXALATE 10 MG PO TABS: 10.0000 mg | ORAL_TABLET | Freq: Every day | ORAL | 0 refills | Status: DC

## 2018-05-04 NOTE — Progress Notes (Signed)
Meds ordered this encounter  Medications  . escitalopram (LEXAPRO) 10 MG tablet    Sig: Take 1 tablet (10 mg total) by mouth daily.    Dispense:  90 tablet    Refill:  0    Order Specific Question:   Supervising Provider    Answer:   Georgina Quint [phone]

## 2018-05-12 ENCOUNTER — Other Ambulatory Visit: Payer: Self-pay

## 2018-05-12 ENCOUNTER — Telehealth: Payer: Self-pay | Admitting: Physician Assistant

## 2018-05-12 NOTE — Telephone Encounter (Signed)
Called pt. Left voicemail. She should not be on both medications. Just one. She should continue with the one her psych prescribed. D/c lexapro if she started it.    Also could you please call the pt's pharm and d/c lexapro?? Thank you.

## 2018-05-12 NOTE — Telephone Encounter (Signed)
Copied from CRM [id number]. Topic: Quick Communication - See Telephone Encounter >> May 12, 2018 10:49 AM Lorrine Kin, NT wrote: CRM for notification. See Telephone encounter for: 05/12/18. Patient calling and would like for [place] to look into her taking both anxiety medications together. States that her psychiatrist prescribed a medication and so did [place]. [place] prescribed escitalopram (LEXAPRO) 10 MG tablet and patient's psychiatrist prescribed amitriptyline 10mg . Would like a call back regarding this.  CB#: [phone]

## 2018-05-12 NOTE — Telephone Encounter (Signed)
Message sent to Danielle Ponce for review Re Anxiety meds

## 2018-05-12 NOTE — Telephone Encounter (Signed)
Called [facility] [address]   LMOVM to cancel the Lexapro prescription per Brittany's message.

## 2018-05-13 NOTE — Telephone Encounter (Signed)
[facility] called back to let the office know the script was picked up on the 17th and was not able to be canceled because the medication had no further refills.

## 2018-05-16 ENCOUNTER — Other Ambulatory Visit: Payer: Self-pay

## 2018-05-16 ENCOUNTER — Ambulatory Visit: Payer: BLUE CROSS/BLUE SHIELD | Attending: Dermatology | Admitting: Physical Therapy

## 2018-05-16 ENCOUNTER — Encounter: Payer: Self-pay | Admitting: Physical Therapy

## 2018-05-16 DIAGNOSIS — R252 Cramp and spasm: Secondary | ICD-10-CM | POA: Diagnosis not present

## 2018-05-16 DIAGNOSIS — M6281 Muscle weakness (generalized): Secondary | ICD-10-CM | POA: Insufficient documentation

## 2018-05-16 NOTE — Therapy (Signed)
[facility] [address] [place], [place], [address] Phone: [phone]   Fax:  [phone]  Physical Therapy Evaluation  Patient Details  Name: Danielle Ponce MRN: [id number] Date of Birth: 01-Apr-1996 Referring Provider (PT): Dr. Hinton Lovely   Encounter Date: 05/16/2018  PT End of Session - 05/16/18 0947    Visit Number  1    Date for PT Re-Evaluation  07/11/18    Authorization Type  BCBS    PT Start Time  0800    PT Stop Time  0840    PT Time Calculation (min)  40 min    Activity Tolerance  Patient tolerated treatment well;No increased pain    Behavior During Therapy  WFL for tasks assessed/performed       Past Medical History:  Diagnosis Date  . Kidney disease 2001    Past Surgical History:  Procedure Laterality Date  . birth mark removal  2000  . TONSILLECTOMY  2004  . WISDOM TOOTH EXTRACTION  2011    There were no vitals filed for this visit.   Subjective Assessment - 05/16/18 0804    Subjective  Patient has had pain with intercourse for 2 years.  Patient has vulvodynia. Pain with initial penetration then deeper. Patietn may have pain for 10-15 minutes afterwards. Pain with vaginal exam.      Patient Stated Goals  no pain with intercourse    Currently in Pain?  Yes    Pain Score  8     Pain Location  Vagina    Pain Orientation  Mid    Pain Descriptors / Indicators  Sharp    Pain Type  Chronic pain    Pain Onset  More than a month ago    Pain Frequency  Intermittent    Aggravating Factors   intercourse and vaginal exam    Pain Relieving Factors  no intercourse    Multiple Pain Sites  No         [facility] PT Assessment - 05/16/18 0001      Assessment   Medical Diagnosis  N94.819 Vulvodynia    Referring Provider (PT)  Dr. Hinton Lovely    Onset Date/Surgical Date  05/16/16    Prior Therapy  none      Precautions   Precautions  None      Restrictions   Weight Bearing Restrictions  No       Balance Screen   Has the patient fallen in the past 6 months  No    Has the patient had a decrease in activity level because of a fear of falling?   No    Is the patient reluctant to leave their home because of a fear of falling?   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    Leisure  runner 2-3 miles 4-5 days per week on treatmill       Cognition   Overall Cognitive Status  Within Functional Limits for tasks assessed      Posture/Postural Control   Posture/Postural Control  No significant limitations      ROM / Strength   AROM / PROM / Strength  AROM;PROM;Strength      AROM   AROM Assessment Site  Lumbar    Lumbar Flexion  full with deviation to left and decrease  movement at L3-L4    Lumbar Extension  full    Lumbar - Right Side Bend  decreased by 25% and leans forward    Lumbar - Left Side Bend  full    Lumbar - Right Rotation  decreased by 25%    Lumbar - Left Rotation  full      Strength   Right Hip Extension  4/5    Right Hip ADduction  4/5    Left Hip ABduction  4/5    Left Hip ADduction  4/5      Palpation   SI assessment   Left ilium is posteriorly rotated; sacrum rotated left    Palpation comment  increased muscle tension in the left abdominals;                 Objective measurements completed on examination: See above findings.    Pelvic Floor Special Questions - 05/16/18 0001    Currently Sexually Active  Yes    Marinoff Scale  pain prevents any attempts at intercourse    Urinary Leakage  No    Skin Integrity  Intact    Pelvic Floor Internal Exam  Patient comfirms identification and approves PT to assess the pelvic floor and treatment    Exam Type  Vaginal    Palpation  Q-tip test with point tenderness along the vulva    Strength  --    unable to determine due to pain              PT Education - 05/16/18 0843    Education Details  information on perineal massage using a q-tip and the outside perineum; you tube video on meditation    Person(s) Educated  Patient    Methods  Explanation;Demonstration;Verbal cues;Handout    Comprehension  Returned demonstration;Verbalized understanding       PT Short Term Goals - 05/16/18 0907      PT SHORT TERM GOAL #1   Title  independent with initial HEP     Time  4    Period  Weeks    Status  New    Target Date  06/13/18      PT SHORT TERM GOAL #2   Title  ability to insert q-tip into the vaginal canal without /= 2/10 pain    Time  4    Period  Weeks    Status  New    Target Date  06/13/18      PT SHORT TERM GOAL #3   Title  understand how to perfrom vaginal massage to relax the muscles of the pelvic floor    Time  4    Period  Weeks    Status  New    Target Date  06/13/18      PT SHORT TERM GOAL #4   Title  pelvis in correct alignment for 2 weeks     Time  4    Period  Weeks    Status  New    Target Date  06/13/18      PT SHORT TERM GOAL #5   Title  understand how to use a vaginal dilator to expand the vaginal canal    Time  4    Period  Weeks    Status  New    Target Date  06/13/18        PT Long Term Goals - 05/16/18 4098  PT LONG TERM GOAL #1   Title  independent with HEP and understand how to progress herself    Time  8    Period  Weeks    Status  New    Target Date  07/11/18      PT LONG TERM GOAL #2   Title  reduce pain to minimal to no pain with penile penetration so Marinoff scale is </= 0/3    Time  8    Period  Weeks    Status  New    Target Date  07/11/18      PT LONG TERM GOAL #3   Title  reduce pain after penile penetration less than 1/10     Time  8    Period  Weeks    Status  New    Target Date  07/11/18      PT LONG TERM GOAL #4   Title  abilty to use the largest dilator to continue the expansion of the  vaginal canal    Time  8    Period  Weeks    Status  New    Target Date  07/11/18             Plan - 05/16/18 0932    Clinical Impression Statement  Patient is a 22 year old female with vulvodynia for the past 2 years.  Patient reports intermittent pain at level 8/10 with penile penetration and 10-15 minutes after penile pentration.  Patient has not been able to have penile penetration for the past 3 months due to pain.  Marinoff score is 3/3.  Left ilium is rotated posteriorly and sacrum rotated left.  Lumbar flexion is full with deviation to the left. Right lumbar sidebending decreased by 25% with trunk flexion. Unable to assess the pelvic floor strength due to pain.  Q-tip touch to the vulva caused pain. Educated patient on the vaginal anatomy and had her use a mirror to understand the anatomy. Patient will benefit from skilled therapy to improve tissue mobiity of the perineum for penile penetration and vaginal exams.     History and Personal Factors relevant to plan of care:  Vulvodynia    Clinical Presentation  Stable    Clinical Presentation due to:  stable condition    Clinical Decision Making  Low    Rehab Potential  Excellent    Clinical Impairments Affecting Rehab Potential  Vulvodynia    PT Frequency  1x / week    PT Duration  8 weeks    PT Treatment/Interventions  Biofeedback;Electrical Stimulation;Moist Heat;Therapeutic exercise;Therapeutic activities;Neuromuscular re-education;Patient/family education;Manual techniques;Dry needling    PT Next Visit Plan  hip stretches; abdominal tissue work; correct pelvis; lumbar joint mobilization; correct sacrum; tissue rolling of the thighs    Consulted and Agree with Plan of Care  Patient       Patient will benefit from skilled therapeutic intervention in order to improve the following deficits and impairments:  Increased fascial restricitons, Pain, Decreased coordination, Increased muscle spasms, Decreased activity tolerance,  Decreased endurance, Decreased strength  Visit Diagnosis: Muscle weakness (generalized) - Plan: PT plan of care cert/re-cert  Cramp and spasm - Plan: PT plan of care cert/re-cert     Problem List Patient Active Problem List   Diagnosis Date Noted  . Chronic kidney disease 01/22/2012  . Proteinuria 01/22/2012  . Reflux nephropathy 01/22/2012    Eulis Foster, PT 05/16/18 9:49 AM   [facility] [address] [place], [place], [address] Phone: [phone]   Fax:  [phone]  Name: Danielle Ponce MRN: [id number] Date of Birth: Dec 31, 1995

## 2018-05-16 NOTE — Patient Instructions (Signed)
Use the q-tip to touch around the vulva area for 1 min. Then do circular massage around the outside of the vulva area for 2-3 minutes.    Guided Meditation for Pelvic Floor Relaxation  [employer]  [facility] [address] [place], [place] [address] Phone # [phone] Fax [phone]

## 2018-05-25 ENCOUNTER — Encounter

## 2018-05-26 ENCOUNTER — Encounter: Payer: BLUE CROSS/BLUE SHIELD | Admitting: Physical Therapy

## 2018-06-02 ENCOUNTER — Ambulatory Visit: Payer: BLUE CROSS/BLUE SHIELD | Attending: Dermatology | Admitting: Physical Therapy

## 2018-06-02 ENCOUNTER — Encounter: Payer: Self-pay | Admitting: Physical Therapy

## 2018-06-02 DIAGNOSIS — R252 Cramp and spasm: Secondary | ICD-10-CM | POA: Diagnosis not present

## 2018-06-02 DIAGNOSIS — M6281 Muscle weakness (generalized): Secondary | ICD-10-CM

## 2018-06-02 NOTE — Patient Instructions (Signed)
Access Code: [email or web address]  URL: [email or web address]  Date: 06/02/2018  Prepared by: Eulis Fosterheryl Zaila Crew   Exercises  Seated Piriformis Stretch with Trunk Bend - 2 reps - 1 sets - 30 sec hold - 1x daily - 7x weekly  Seated Hamstring Stretch - 2 reps - 1 sets - 30 sec hold - 1x daily - 7x weekly  Supine Pelvic Floor Stretch - 1 reps - 1 sets - 1 min hold - 1x daily - 7x weekly  Supine Lower Trunk Rotation - 1 reps - 1 sets - 30 sec hold - 1x daily - 7x weekly  Sidelying Quadriceps Stretch - 1 reps - 1 sets - 30 sec hold - 1x daily - 7x weekly  Cat-Camel - 10 reps - 1 sets - 1x daily - 7x weekly  Child's Pose Stretch - 1 reps - 1 sets - 30 sec hold - 1x daily - 7x weekly  Child's Pose with Sidebending - 2 reps - 1 sets - 30 sec hold - 1x daily - 7x weekly  [facility] [address] [place], [place] [address] Phone # [phone] Fax [phone]

## 2018-06-02 NOTE — Therapy (Signed)
[facility] [address] [place], [place], [address] Phone: [phone]   Fax:  [phone]  Physical Therapy Treatment  Patient Details  Name: [name] MRN: [id number] Date of Birth: 21-Jan-1996 Referring Provider (PT): Dr. Hinton LovelyLibby Edwards   Encounter Date: 06/02/2018  PT End of Session - 06/02/18 0933    Visit Number  2    Date for PT Re-Evaluation  07/11/18    Authorization Type  BCBS    PT Start Time  0930    PT Stop Time  1010    PT Time Calculation (min)  40 min    Activity Tolerance  Patient tolerated treatment well;No increased pain    Behavior During Therapy  WFL for tasks assessed/performed       Past Medical History:  Diagnosis Date  . Kidney disease 2001    Past Surgical History:  Procedure Laterality Date  . birth mark removal  2000  . TONSILLECTOMY  2004  . WISDOM TOOTH EXTRACTION  2011    There were no vitals filed for this visit.  Subjective Assessment - 06/02/18 0934    Subjective  I felt alright after last visit. No changes.     Patient Stated Goals  no pain with intercourse    Currently in Pain?  Yes    Pain Score  8     Pain Location  Vagina    Pain Orientation  Mid    Pain Descriptors / Indicators  Sharp    Pain Type  Chronic pain    Pain Onset  More than a month ago    Pain Frequency  Intermittent    Aggravating Factors   intercourse and vaginal exam    Pain Relieving Factors  no intercourse    Multiple Pain Sites  No                       [facility] Adult PT Treatment/Exercise - 06/02/18 0001      Exercises   Exercises  Lumbar      Lumbar Exercises: Stretches   Active Hamstring Stretch  Right;Left;1 rep;30 seconds    Active Hamstring Stretch Limitations  sitting    Quadruped Mid Back Stretch  3 reps;30 seconds   all three directions   Quad Stretch  Right;Left;1 rep;30 seconds   sidely   Piriformis Stretch  Right;Left;1 rep;30 seconds   sitting      Lumbar Exercises: Quadruped   Madcat/Old Horse  15 reps      Manual Therapy   Manual Therapy  Soft tissue mobilization;Joint mobilization;Muscle Energy Technique    Manual therapy comments  tissue rolling to thighs and hamstrings with prickly roller    Joint Mobilization  rotational and pa mobilization to L1-L5 grade 3    Soft tissue mobilization  bil. lumbar paraspinals, right quadratus    Muscle Energy Technique  correct right ilium             PT Education - 06/02/18 1009    Education Details  Access Code: [email or web address]    Person(s) Educated  Patient    Methods  Explanation;Verbal cues;Demonstration;Handout    Comprehension  Returned demonstration;Verbalized understanding       PT Short Term Goals - 06/02/18 1015      PT SHORT TERM GOAL #1   Title  independent with initial HEP     Time  4    Period  Weeks    Status  On-going  PT SHORT TERM GOAL #2   Title  ability to insert q-tip into the vaginal canal without /= 2/10 pain    Baseline  6/10    Time  4    Period  Weeks    Status  On-going      PT SHORT TERM GOAL #3   Title  understand how to perfrom vaginal massage to relax the muscles of the pelvic floor    Time  4    Period  Weeks    Status  On-going      PT SHORT TERM GOAL #4   Title  pelvis in correct alignment for 2 weeks     Time  4    Period  Weeks    Status  On-going      PT SHORT TERM GOAL #5   Title  understand how to use a vaginal dilator to expand the vaginal canal    Time  4    Period  Weeks    Status  On-going        PT Long Term Goals - 05/16/18 6295      PT LONG TERM GOAL #1   Title  independent with HEP and understand how to progress herself    Time  8    Period  Weeks    Status  New    Target Date  07/11/18      PT LONG TERM GOAL #2   Title  reduce pain to minimal to no pain with penile penetration so Marinoff scale is </= 0/3    Time  8    Period  Weeks    Status  New    Target Date  07/11/18      PT LONG TERM GOAL  #3   Title  reduce pain after penile penetration less than 1/10     Time  8    Period  Weeks    Status  New    Target Date  07/11/18      PT LONG TERM GOAL #4   Title  abilty to use the largest dilator to continue the expansion of the vaginal canal    Time  8    Period  Weeks    Status  New    Target Date  07/11/18            Plan - 06/02/18 0935    Clinical Impression Statement  Patient is performing her HEP. Patient has learned how to stretch her hips to relax the pelvic floor. Patient has not change in pain yet due to just starting her therapy.  Patient pelvis was in correct alignment after therapy.  Patient will benefit from skilled therapy to improve tissue mobility of the prineum for penile penetration and vaginal exams.     Rehab Potential  Excellent    Clinical Impairments Affecting Rehab Potential  Vulvodynia    PT Frequency  1x / week    PT Duration  8 weeks    PT Treatment/Interventions  Biofeedback;Electrical Stimulation;Moist Heat;Therapeutic exercise;Therapeutic activities;Neuromuscular re-education;Patient/family education;Manual techniques;Dry needling    PT Next Visit Plan  abdominal tissue work; perineal myofascial work, tissue rolling of thighs, check pelvis    PT Home Exercise Plan  Access Code: [email or web address]    Recommended Other Services  MD signed initial eval    Consulted and Agree with Plan of Care  Patient       Patient will benefit from skilled therapeutic intervention in order to improve the following deficits  and impairments:  Increased fascial restricitons, Pain, Decreased coordination, Increased muscle spasms, Decreased activity tolerance, Decreased endurance, Decreased strength  Visit Diagnosis: Muscle weakness (generalized)  Cramp and spasm     Problem List Patient Active Problem List   Diagnosis Date Noted  . Chronic kidney disease 01/22/2012  . Proteinuria 01/22/2012  . Reflux nephropathy 01/22/2012    Eulis Foster, PT 06/02/18 10:17  AM   [facility] [address] [place], [place], [address] Phone: [phone]   Fax:  [phone]  Name: Danielle Ponce MRN: [id number] Date of Birth: April 07, 1996

## 2018-06-23 ENCOUNTER — Encounter: Payer: Self-pay | Admitting: Physical Therapy

## 2018-06-23 ENCOUNTER — Ambulatory Visit: Payer: BLUE CROSS/BLUE SHIELD | Attending: Dermatology | Admitting: Physical Therapy

## 2018-06-23 DIAGNOSIS — M6281 Muscle weakness (generalized): Secondary | ICD-10-CM | POA: Insufficient documentation

## 2018-06-23 DIAGNOSIS — R252 Cramp and spasm: Secondary | ICD-10-CM | POA: Insufficient documentation

## 2018-06-23 NOTE — Therapy (Signed)
[facility] [address], [address] [place], [place], [address] Phone: [phone]   Fax:  [phone]  Physical Therapy Treatment  Patient Details  Name: Danielle Ponce MRN: [id number] Date of Birth: Oct 08, 1995 Referring Provider (PT): Dr. Hinton LovelyLibby Edwards   Encounter Date: 06/23/2018  PT End of Session - 06/23/18 0843    Visit Number  3    Date for PT Re-Evaluation  07/11/18    Authorization Type  BCBS    PT Start Time  0845    PT Stop Time  0925    PT Time Calculation (min)  40 min    Activity Tolerance  Patient tolerated treatment well;No increased pain    Behavior During Therapy  WFL for tasks assessed/performed       Past Medical History:  Diagnosis Date  . Kidney disease 2001    Past Surgical History:  Procedure Laterality Date  . birth mark removal  2000  . TONSILLECTOMY  2004  . WISDOM TOOTH EXTRACTION  2011    There were no vitals filed for this visit.  Subjective Assessment - 06/23/18 0845    Subjective  I have been doing the exercises and are helping. I do not feel as tense especially in my back.     Patient Stated Goals  no pain with intercourse    Currently in Pain?  Yes    Pain Score  8     Pain Location  Vagina    Pain Orientation  Mid    Pain Descriptors / Indicators  Sharp    Pain Type  Chronic pain    Pain Onset  More than a month ago    Pain Frequency  Intermittent    Aggravating Factors   intercourse and vaginal exam    Pain Relieving Factors  no intercourse    Multiple Pain Sites  No         [facility] PT Assessment - 06/23/18 0001      Palpation   SI assessment   pelvis in correct alignment                   [facility] Adult PT Treatment/Exercise - 06/23/18 0001      Self-Care   Self-Care  Other Self-Care Comments    Other Self-Care Comments   instruction on how to use q-tip into the vaginal canal to start desentizing the area for 5 min daily      Manual Therapy   Manual  Therapy  Soft tissue mobilization;Myofascial release    Manual therapy comments  tissue rolling to bilateral thights    Soft tissue mobilization  bil. levator ani and obturator internist outside pants due to being on menstraul cycle; abdominal soft tissue work     Myofascial Release  to bilateral hamstring, quads, hip adductors, and ITB             PT Education - 06/23/18 0929    Education Details  how to use the q-tip to desentize the area    Person(s) Educated  Patient    Methods  Explanation    Comprehension  Verbalized understanding       PT Short Term Goals - 06/23/18 0932      PT SHORT TERM GOAL #1   Title  independent with initial HEP     Time  4    Period  Weeks    Status  Achieved        PT Long Term Goals -  05/16/18 0839      PT LONG TERM GOAL #1   Title  independent with HEP and understand how to progress herself    Time  8    Period  Weeks    Status  New    Target Date  07/11/18      PT LONG TERM GOAL #2   Title  reduce pain to minimal to no pain with penile penetration so Marinoff scale is </= 0/3    Time  8    Period  Weeks    Status  New    Target Date  07/11/18      PT LONG TERM GOAL #3   Title  reduce pain after penile penetration less than 1/10     Time  8    Period  Weeks    Status  New    Target Date  07/11/18      PT LONG TERM GOAL #4   Title  abilty to use the largest dilator to continue the expansion of the vaginal canal    Time  8    Period  Weeks    Status  New    Target Date  07/11/18            Plan - 06/23/18 0844    Clinical Impression Statement  Patient still has the same pain with intercourse. Pelvis in correct alignment since last siession. Patient is on her cycle so we did not do perineal tissue work. Patient had improved tissue mobility of the abdomen after manal work. Patient will benefit from skilled therapy to improve tissue mobility of the perineum for penile penetration and vaginal exams.     Rehab Potential   Excellent    Clinical Impairments Affecting Rehab Potential  Vulvodynia    PT Frequency  1x / week    PT Duration  8 weeks    PT Treatment/Interventions  Biofeedback;Electrical Stimulation;Moist Heat;Therapeutic exercise;Therapeutic activities;Neuromuscular re-education;Patient/family education;Manual techniques;Dry needling    PT Next Visit Plan  abdominal tissue work; perineal myofascial work, tissue rolling of thighs, check pelvis    PT Home Exercise Plan  Access Code: [email or web address]    Consulted and Agree with Plan of Care  Patient       Patient will benefit from skilled therapeutic intervention in order to improve the following deficits and impairments:  Increased fascial restricitons, Pain, Decreased coordination, Increased muscle spasms, Decreased activity tolerance, Decreased endurance, Decreased strength  Visit Diagnosis: Muscle weakness (generalized)  Cramp and spasm     Problem List Patient Active Problem List   Diagnosis Date Noted  . Chronic kidney disease 01/22/2012  . Proteinuria 01/22/2012  . Reflux nephropathy 01/22/2012    Eulis Foster, PT 06/23/18 9:33 AM   [facility] [address] [place], [place], [address] Phone: [phone]   Fax:  [phone]  Name: Danielle Ponce MRN: [id number] Date of Birth: 18-Jan-1996

## 2018-06-29 ENCOUNTER — Ambulatory Visit: Payer: BLUE CROSS/BLUE SHIELD | Admitting: Physical Therapy

## 2018-06-29 ENCOUNTER — Encounter: Payer: Self-pay | Admitting: Physical Therapy

## 2018-06-29 DIAGNOSIS — R252 Cramp and spasm: Secondary | ICD-10-CM

## 2018-06-29 DIAGNOSIS — M6281 Muscle weakness (generalized): Secondary | ICD-10-CM | POA: Diagnosis not present

## 2018-06-29 NOTE — Therapy (Signed)
[facility] [address], [address] [place], [place], [address] Phone: [phone]   Fax:  [phone]  Physical Therapy Treatment  Patient Details  Name: Danielle Ponce MRN: [id number] Date of Birth: [birth year] Referring Provider (PT): Dr. Hinton Lovely   Encounter Date: 06/29/2018  PT End of Session - 06/29/18 0934    Visit Number  4    Date for PT Re-Evaluation  07/11/18    Authorization Type  BCBS    PT Start Time  0930    PT Stop Time  1010    PT Time Calculation (min)  40 min    Activity Tolerance  Patient tolerated treatment well;No increased pain    Behavior During Therapy  WFL for tasks assessed/performed       Past Medical History:  Diagnosis Date  . Kidney disease 2001    Past Surgical History:  Procedure Laterality Date  . birth mark removal  2000  . TONSILLECTOMY  2004  . WISDOM TOOTH EXTRACTION  2011    There were no vitals filed for this visit.  Subjective Assessment - 06/29/18 0849    Subjective  No changes yet.     Patient Stated Goals  no pain with intercourse    Currently in Pain?  Yes    Pain Score  8     Pain Location  Vagina    Pain Orientation  Mid    Pain Descriptors / Indicators  Sharp    Pain Type  Chronic pain    Pain Onset  More than a month ago    Pain Frequency  Intermittent    Aggravating Factors   intercourse and vaginal exam    Pain Relieving Factors  no intercourse    Multiple Pain Sites  No         [facility] PT Assessment - 06/29/18 0001      Palpation   SI assessment   pelvis in correct alignment                Pelvic Floor Special Questions - 06/29/18 0001    Pelvic Floor Internal Exam  Patient comfirms identification and approves PT to assess the pelvic floor and treatment    Exam Type  Vaginal    Palpation  tenderness on the vuvla area was less with manual work compared to the past        [facility] Adult PT Treatment/Exercise - 06/29/18 0001      Self-Care   Self-Care  Other Self-Care Comments    Other Self-Care Comments   watched videos on pain and how to retrain the brain while having pain      Lumbar Exercises: Stretches   Passive Hamstring Stretch  Right;Left;1 rep;30 seconds    Piriformis Stretch  Right;Left;1 rep;30 seconds    Other Lumbar Stretch Exercise  happy baby with breath    Other Lumbar Stretch Exercise  stretch both hip adductors hold 30 sec. by the therapist      Manual Therapy   Manual Therapy  Soft tissue mobilization;Myofascial release    Soft tissue mobilization  bil. hamstring, hip adductors and quadricesp prior to perineal wok    Myofascial Release  myofascial release to the perineum around the introitus while using Revelum cream               PT Short Term Goals - 06/29/18 6962      PT SHORT TERM GOAL #1   Title  independent with initial HEP  Time  4    Period  Weeks    Status  Achieved      PT SHORT TERM GOAL #2   Title  ability to insert q-tip into the vaginal canal without /= 2/10 pain    Baseline  4/10    Time  4    Period  Weeks    Status  On-going      PT SHORT TERM GOAL #3   Title  understand how to perfrom vaginal massage to relax the muscles of the pelvic floor    Time  4    Period  Weeks    Status  On-going      PT SHORT TERM GOAL #4   Title  pelvis in correct alignment for 2 weeks     Time  4    Period  Weeks    Status  Achieved      PT SHORT TERM GOAL #5   Title  understand how to use a vaginal dilator to expand the vaginal canal    Baseline  not ready yet    Time  4    Period  Weeks    Status  On-going        PT Long Term Goals - 05/16/18 [id number]      PT LONG TERM GOAL #1   Title  independent with HEP and understand how to progress herself    Time  8    Period  Weeks    Status  New    Target Date  07/11/18      PT LONG TERM GOAL #2   Title  reduce pain to minimal to no pain with penile penetration so Marinoff scale is </= 0/3    Time  8    Period   Weeks    Status  New    Target Date  07/11/18      PT LONG TERM GOAL #3   Title  reduce pain after penile penetration less than 1/10     Time  8    Period  Weeks    Status  New    Target Date  07/11/18      PT LONG TERM GOAL #4   Title  abilty to use the largest dilator to continue the expansion of the vaginal canal    Time  8    Period  Weeks    Status  New    Target Date  07/11/18            Plan - 06/29/18 [id number]    Clinical Impression Statement  Patient has had less pain with perineal myofascial work than in the past. Patient has not had intercourse in several weeks to see if there is pain. Patient has watched pain videos to retrain the pain. Patient has improve tissue mobility of the inner thighs and hamstring after therapy. Patient  pelvic is in correct alignment for several weeks now. Patient will benefit from skilled therapy to improve tissue mobility of the perineum for penile penetration and vaginal exams.     Rehab Potential  Excellent    Clinical Impairments Affecting Rehab Potential  Vulvodynia    PT Frequency  1x / week    PT Duration  8 weeks    PT Treatment/Interventions  Biofeedback;Electrical Stimulation;Moist Heat;Therapeutic exercise;Therapeutic activities;Neuromuscular re-education;Patient/family education;Manual techniques;Dry needling    PT Next Visit Plan   perineal myofascial work, tissue rolling of thighs; see what MD says    PT Home Exercise Plan  Access Code: [email or web address]    Consulted and Agree with Plan of Care  Patient       Patient will benefit from skilled therapeutic intervention in order to improve the following deficits and impairments:  Increased fascial restricitons, Pain, Decreased coordination, Increased muscle spasms, Decreased activity tolerance, Decreased endurance, Decreased strength  Visit Diagnosis: Muscle weakness (generalized)  Cramp and spasm     Problem List Patient Active Problem List   Diagnosis Date Noted  . Chronic  kidney disease 01/22/2012  . Proteinuria 01/22/2012  . Reflux nephropathy 01/22/2012    Eulis Foster, PT 06/29/18 9:38 AM   [facility] [address] [place], [place], [address] Phone: [phone]   Fax:  [phone]  Name: Danielle Harrel MRN: [id number] Date of Birth: Jul 25, 1995

## 2018-06-30 ENCOUNTER — Encounter: Payer: BLUE CROSS/BLUE SHIELD | Admitting: Physical Therapy

## 2018-06-30 DIAGNOSIS — N760 Acute vaginitis: Secondary | ICD-10-CM | POA: Diagnosis not present

## 2018-06-30 DIAGNOSIS — N94819 Vulvodynia, unspecified: Secondary | ICD-10-CM | POA: Diagnosis not present

## 2018-07-07 ENCOUNTER — Encounter: Payer: Self-pay | Admitting: Physical Therapy

## 2018-07-07 ENCOUNTER — Ambulatory Visit: Payer: BLUE CROSS/BLUE SHIELD | Admitting: Physical Therapy

## 2018-07-07 DIAGNOSIS — M6281 Muscle weakness (generalized): Secondary | ICD-10-CM

## 2018-07-07 DIAGNOSIS — R252 Cramp and spasm: Secondary | ICD-10-CM

## 2018-07-07 NOTE — Therapy (Signed)
[facility] [address], [address] [place], [place], [address] Phone: [phone]   Fax:  [phone]  Physical Therapy Treatment  Patient Details  Name: Danielle Ponce MRN: [id number] Date of Birth: [birth year] Referring Provider (PT): Dr. Hinton LovelyLibby Edwards   Encounter Date: 07/07/2018  PT End of Session - 07/07/18 0955    Visit Number  5    Date for PT Re-Evaluation  10/03/18    Authorization Type  BCBS    Authorization - Visit Number  5    Authorization - Number of Visits  20    PT Start Time  0800    PT Stop Time  0838    PT Time Calculation (min)  38 min    Activity Tolerance  Patient tolerated treatment well;No increased pain    Behavior During Therapy  WFL for tasks assessed/performed       Past Medical History:  Diagnosis Date  . Kidney disease 2001    Past Surgical History:  Procedure Laterality Date  . birth mark removal  2000  . TONSILLECTOMY  2004  . WISDOM TOOTH EXTRACTION  2011    There were no vitals filed for this visit.  Subjective Assessment - 07/07/18 0807    Subjective  I have been using the q-tip. I have pain with the initial penetration then it will stop hurting. Pain is 3/10.     Patient Stated Goals  no pain with intercourse    Currently in Pain?  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 Hinton LovelyLibby Edwards    Onset Date/Surgical Date  05/16/16    Prior Therapy  none      Precautions   Precautions  None      Balance Screen   Has the patient fallen in the past 6  months  No    Has the patient had a decrease in activity level because of a fear of falling?   No    Is the patient reluctant to leave their home because of a fear of falling?   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[employer]    Leisure  runner 2-3 miles 4-5 days per week on treatmill       Cognition   Overall Cognitive Status  Within Functional Limits for tasks assessed      Posture/Postural Control   Posture/Postural Control  No significant limitations      AROM   Lumbar Flexion  full with deviation to left and decrease  movement at L3-L4    Lumbar Extension  full    Lumbar - Right  Side Bend  decreased by 25% and leans forward    Lumbar - Left Side Bend  full    Lumbar - Right Rotation  decreased by 25%    Lumbar - Left Rotation  full      Strength   Right Hip Extension  4/5    Right Hip ADduction  4/5    Left Hip ABduction  4/5    Left Hip ADduction  4/5      Palpation   SI assessment   pelvis in correct alignment                Pelvic Floor Special Questions - 07/07/18 0001    Currently Sexually Active  Yes    Marinoff Scale  pain prevents any attempts at intercourse    Urinary Leakage  No    Pelvic Floor Internal Exam  Patient comfirms identification and approves PT to assess the pelvic floor and treatment    Exam Type  Vaginal    Palpation  tenderness on the vuvla area was less with manual work compared to the past    Strength  --   unable to assess due to q-tip is 3/10 pain inserted       [facility] Adult PT Treatment/Exercise - 07/07/18 0001      Manual Therapy   Manual Therapy  Myofascial release;Soft tissue mobilization    Soft tissue mobilization  bil. hamstring, quads, hip adductors    Myofascial Release  myofascial release along the inner thigh to the  perineum to elongate the tissue. after manual work able to press deeper into the pelvic floor muscles due to elongation               PT Short Term Goals - 07/07/18 0954      PT SHORT TERM GOAL #1   Title  independent with initial HEP     Time  4    Period  Weeks    Status  Achieved      PT SHORT TERM GOAL #2   Title  ability to insert q-tip into the vaginal canal without /= 2/10 pain    Baseline  3-4/10    Time  4    Period  Weeks    Status  On-going      PT SHORT TERM GOAL #3   Title  understand how to perfrom vaginal massage to relax the muscles of the pelvic floor    Time  4    Period  Weeks    Status  On-going      PT SHORT TERM GOAL #4   Title  pelvis in correct alignment for 2 weeks     Period  Weeks    Status  Achieved      PT SHORT TERM GOAL #5   Title  understand how to use a vaginal dilator to expand the vaginal canal    Baseline  not ready yet    Time  4    Period  Weeks    Status  On-going        PT Long Term Goals - 05/16/18 [id number]      PT LONG TERM GOAL #1   Title  independent with HEP and understand how to progress herself    Time  8    Period  Weeks    Status  New    Target Date  07/11/18      PT LONG TERM GOAL #2   Title  reduce pain  to minimal to no pain with penile penetration so Marinoff scale is </= 0/3    Time  8    Period  Weeks    Status  New    Target Date  07/11/18      PT LONG TERM GOAL #3   Title  reduce pain after penile penetration less than 1/10     Time  8    Period  Weeks    Status  New    Target Date  07/11/18      PT LONG TERM GOAL #4   Title  abilty to use the largest dilator to continue the expansion of the vaginal canal    Time  8    Period  Weeks    Status  New    Target Date  07/11/18            Plan - 07/07/18 0956    Clinical Impression Statement  Patient is still not able to have penile penetration due to pain. Patient is able to insert a q-tip in the vaginal canal with 3-4/10 pain. Patient  is able to tolerate perineal soft tissue work when using Revelum cream. Perineal tissue will soften with with myofascial release and soft tissue work. Patient is not ready for internal work due to pain level. Patient pelvis is in correct alignment for several weeks. Patient contines to have decreased lumbar ROM and weakness in hips. Patient will benefit from skilled therapy to improve tissue mobility of the perineum bor penile penetration and vaginal exam.     Rehab Potential  Excellent    Clinical Impairments Affecting Rehab Potential  Vulvodynia    PT Frequency  1x / week    PT Duration  8 weeks    PT Treatment/Interventions  Biofeedback;Electrical Stimulation;Moist Heat;Therapeutic exercise;Therapeutic activities;Neuromuscular re-education;Patient/family education;Manual techniques;Dry needling    PT Next Visit Plan   perineal myofascial work, tissue rolling of thighs; use relvelum with perineal soft tissue work    PT Home Exercise Plan  Access Code: [email or web address]    Consulted and Agree with Plan of Care  Patient       Patient will benefit from skilled therapeutic intervention in order to improve the following deficits and impairments:  Increased fascial restricitons, Pain, Decreased coordination, Increased muscle spasms, Decreased activity tolerance, Decreased endurance, Decreased strength  Visit Diagnosis: Muscle weakness (generalized)  Cramp and spasm     Problem List Patient Active Problem List   Diagnosis Date Noted  . Chronic kidney disease 01/22/2012  . Proteinuria 01/22/2012  . Reflux nephropathy 01/22/2012    Eulis Foster, PT 07/07/18 10:02 AM   [facility] [address], [address] [place], [place], [address] Phone: [phone]   Fax:  [phone]  Name: Danielle Ponce MRN: [id number] Date of Birth: 03/06/1996

## 2018-07-14 ENCOUNTER — Encounter: Payer: BLUE CROSS/BLUE SHIELD | Admitting: Physical Therapy

## 2018-07-28 ENCOUNTER — Encounter: Payer: Self-pay | Admitting: Physical Therapy

## 2018-07-28 ENCOUNTER — Ambulatory Visit: Payer: BLUE CROSS/BLUE SHIELD | Attending: Dermatology | Admitting: Physical Therapy

## 2018-07-28 DIAGNOSIS — M6281 Muscle weakness (generalized): Secondary | ICD-10-CM | POA: Diagnosis not present

## 2018-07-28 DIAGNOSIS — R252 Cramp and spasm: Secondary | ICD-10-CM | POA: Insufficient documentation

## 2018-07-28 NOTE — Patient Instructions (Addendum)
STRETCHING THE PELVIC FLOOR MUSCLES NO DILATOR  Supplies . Vaginal lubricant . Mirror (optional) . Gloves (optional) Positioning . Start in a semi-reclined position with your head propped up. Bend your knees and place your thumb or finger at the vaginal opening. Procedure . Apply a moderate amount of lubricant on the outer skin of your vagina, the labia minora.  Apply additional lubricant to your finger. Marland Kitchen Spread the skin away from the vaginal opening. Place the end of your finger at the opening. . Do a maximum contraction of the pelvic floor muscles. Tighten the vagina and the anus maximally and relax. . When you know they are relaxed, gently and slowly insert your finger into your vagina, directing your finger slightly downward, for 2-3 inches of insertion. . Relax and stretch the 6 o'clock position . Hold each stretch for _2 min__ and repeat __1_ time with rest breaks of _1__ seconds between each stretch. . Repeat the stretching in the 4 o'clock and 8 o'clock positions. . Total time should be _6__ minutes, _1__ x per day.  Note the amount of theme your were able to achieve and your tolerance to your finger in your vagina. . Once you have accomplished the techniques you may try them in standing with one foot resting on the tub, or in other positions.  This is a good stretch to do in the shower if you don't need to use lubricant.    Vaginismus.[facility] [address], [address] [place], [place] [address] Phone # [phone] Fax [phone]   Access Code: [email or web address]  URL: [email or web address]  Date: 07/28/2018  Prepared by: Eulis Foster   Exercises  Seated Piriformis Stretch with Trunk Bend - 2 reps - 1 sets - 30 sec hold - 1x daily - 7x weekly  Seated Hamstring Stretch - 2 reps - 1 sets - 30 sec hold - 1x daily - 7x weekly  Supine Pelvic Floor Stretch - 1 reps - 1 sets - 1 min hold - 1x daily - 7x weekly  Supine Lower Trunk Rotation - 1 reps - 1  sets - 30 sec hold - 1x daily - 7x weekly  Sidelying Quadriceps Stretch - 1 reps - 1 sets - 30 sec hold - 1x daily - 7x weekly  Cat-Camel - 10 reps - 1 sets - 1x daily - 7x weekly  Child's Pose Stretch - 1 reps - 1 sets - 30 sec hold - 1x daily - 7x weekly  Child's Pose with Sidebending - 2 reps - 1 sets - 30 sec hold - 1x daily - 7x weekly  Piriformis Mobilization on Foam Roll - 10 reps - 1 sets - 1x daily - 7x weekly  Hamstring Mobilization on Foam Roll - 10 reps - 1 sets - 1x daily - 7x weekly  Quadriceps Mobilization with Foam Roll - 10 reps - 1 sets - 1x daily - 7x weekly  Sidelying IT Band Foam Roll Mobilization - 10 reps - 1 sets - 1x daily - 7x weekly

## 2018-07-28 NOTE — Therapy (Signed)
[facility] [address] [place], [place], [address] Phone: [phone]   Fax:  [phone]  Physical Therapy Treatment  Patient Details  Name: Danielle Ponce MRN: [id number] Date of Birth: [birth year] Referring Provider (PT): Dr. Hinton Lovely   Encounter Date: 07/28/2018  PT End of Session - 07/28/18 0904    Visit Number  6    Date for PT Re-Evaluation  10/03/18    Authorization Type  BC[employer]    Authorization - Visit Number  1    Authorization - Number of Visits  20    PT Start Time  0845    PT Stop Time  0930    PT Time Calculation (min)  45 min    Activity Tolerance  Patient tolerated treatment well;No increased pain    Behavior During Therapy  WFL for tasks assessed/performed       Past Medical History:  Diagnosis Date  . Kidney disease 2001    Past Surgical History:  Procedure Laterality Date  . birth mark removal  2000  . TONSILLECTOMY  2004  . WISDOM TOOTH EXTRACTION  2011    There were no vitals filed for this visit.  Subjective Assessment - 07/28/18 0847    Subjective  It is easier to use the q-tips. sometimes the pain is 2/10.     Patient Stated Goals  no pain with intercourse    Currently in Pain?  Yes    Pain Score  8     Pain Location  Vagina    Pain Orientation  Mid    Pain Descriptors / Indicators  Sharp    Pain Type  Chronic pain    Pain Onset  More than a month ago    Pain Frequency  Intermittent    Aggravating Factors   intercourse and vaginal exam    Pain Relieving Factors  no intercourse    Multiple Pain Sites  No                    Pelvic Floor Special Questions - 07/28/18 0001    Pelvic Floor Internal Exam  Patient comfirms identification and approves PT to assess the pelvic floor and treatment    Exam Type  Vaginal        [facility] Adult PT Treatment/Exercise - 07/28/18 0001      Lumbar Exercises: Stretches   Passive Hamstring Stretch  Right;Left   using  foam roll supine   Quadruped Mid Back Stretch  3 reps;30 seconds   all three directions   Quad Stretch  Left;Right   using foam roll prone   ITB Stretch  Right;Left   using foam roll   Piriformis Stretch  Right;Left   using foam roll     Lumbar Exercises: Quadruped   Madcat/Old Horse  15 reps      Manual Therapy   Manual Therapy  Myofascial release;Internal Pelvic Floor    Myofascial Release  myofascial release along the inner thigh to the perineum to elongate the tissue. after manual work able to press deeper into the pelvic floor muscles due to elongation    Internal Pelvic Floor  along the perineal body, sides of introitus,              PT Education - 07/28/18 0934    Education Details  how to stretch the introitus; where to buy dilators; gave patient samples of lubricant ; tAccess Code: [email or web address]     Person(s) Educated  Patient    Methods  Explanation;Demonstration;Verbal cues;Handout    Comprehension  Returned demonstration;Verbalized understanding       PT Short Term Goals - 07/28/18 0942      PT SHORT TERM GOAL #2   Title  ability to insert q-tip into the vaginal canal without /= 2/10 pain    Baseline  1/10    Time  4    Period  Weeks    Status  Achieved      PT SHORT TERM GOAL #3   Title  understand how to perfrom vaginal massage to relax the muscles of the pelvic floor    Time  4    Period  Weeks    Status  Achieved      PT SHORT TERM GOAL #5   Title  understand how to use a vaginal dilator to expand the vaginal canal    Baseline  going to order the dilators    Time  4    Period  Weeks    Status  On-going        PT Long Term Goals - 05/16/18 5176      PT LONG TERM GOAL #1   Title  independent with HEP and understand how to progress herself    Time  8    Period  Weeks    Status  New    Target Date  07/11/18      PT LONG TERM GOAL #2   Title  reduce pain to minimal to no pain with penile penetration so Marinoff scale is </= 0/3    Time  8     Period  Weeks    Status  New    Target Date  07/11/18      PT LONG TERM GOAL #3   Title  reduce pain after penile penetration less than 1/10     Time  8    Period  Weeks    Status  New    Target Date  07/11/18      PT LONG TERM GOAL #4   Title  abilty to use the largest dilator to continue the expansion of the vaginal canal    Time  8    Period  Weeks    Status  New    Target Date  07/11/18            Plan - 07/28/18 0853    Clinical Impression Statement  Patient able to tolerate soft tissue work to the introitus and internnal 1 inch with no more than 3/10 pain. Patient has thickness on the perineal body and decreased tissue mobility of the sphincter muscles.  Patient is able to use the Q-tip now without pain majority of the time.  Patient is starting to use the foam roll to soften the thigh and gluteal tissue. Patient has tight hip adductors. Patient will benefit from skilled therapy to improve tissue mobility of the perineum for penile penetration and vaginal exam.     Rehab Potential  Excellent    Clinical Impairments Affecting Rehab Potential  Vulvodynia    PT Frequency  1x / week    PT Duration  8 weeks    PT Treatment/Interventions  Biofeedback;Electrical Stimulation;Moist Heat;Therapeutic exercise;Therapeutic activities;Neuromuscular re-education;Patient/family education;Manual techniques;Dry needling    PT Next Visit Plan   perineal myofascial work, tissue rolling of thighs; use relvelum with perineal soft tissue work; dry needling to hip adductors    PT Home Exercise Plan  Access Code: [email or web address]  Consulted and Agree with Plan of Care  Patient       Patient will benefit from skilled therapeutic intervention in order to improve the following deficits and impairments:  Increased fascial restricitons, Pain, Decreased coordination, Increased muscle spasms, Decreased activity tolerance, Decreased endurance, Decreased strength  Visit Diagnosis: Muscle weakness  (generalized)  Cramp and spasm     Problem List Patient Active Problem List   Diagnosis Date Noted  . Chronic kidney disease 01/22/2012  . Proteinuria 01/22/2012  . Reflux nephropathy 01/22/2012    Eulis Fosterheryl Gray, PT 07/28/18 9:44 AM   [facility] [address], [address] [place], [place], [address] Phone: [phone]   Fax:  [phone]  Name: [place] Molitor MRN: [id number] Date of Birth: 1996/06/20

## 2018-07-29 ENCOUNTER — Other Ambulatory Visit: Payer: Self-pay | Admitting: Physician Assistant

## 2018-08-04 ENCOUNTER — Ambulatory Visit: Payer: BLUE CROSS/BLUE SHIELD | Admitting: Physical Therapy

## 2018-08-04 ENCOUNTER — Encounter: Payer: Self-pay | Admitting: Physical Therapy

## 2018-08-04 ENCOUNTER — Encounter

## 2018-08-04 DIAGNOSIS — M6281 Muscle weakness (generalized): Secondary | ICD-10-CM

## 2018-08-04 DIAGNOSIS — R252 Cramp and spasm: Secondary | ICD-10-CM | POA: Diagnosis not present

## 2018-08-04 DIAGNOSIS — R5381 Other malaise: Secondary | ICD-10-CM | POA: Diagnosis not present

## 2018-08-04 DIAGNOSIS — R079 Chest pain, unspecified: Secondary | ICD-10-CM | POA: Diagnosis not present

## 2018-08-04 NOTE — Patient Instructions (Addendum)
  Start rubbing the dilator in a circular fashion around the vulva area daily for 2 minutes.  PROTOCOL FOR DILATORS   1. Wash dilator with soap and water prior to insertion.    2. Lay on your back reclined. Knees are to be up and apart while on your bed or in the bathtub with warm water.   3. Lubricate the end of the dilator with a water-soluble lubricant.  4. Separate the labia.   5. Tense the pelvic floor muscles than relax; while relaxing, slide lubricated dilator into the vagina.    6. Tense muscles again while holding the dilator so it does not get pushed out; relax and slide it in a little further.   7. Try blowing out as if filling a balloon; this may relax the muscles and allow penetration.  Repeat blowing out to insert dilator further.  8. Keep dilator in for 10 minutes if tolerate, with the pelvic floor muscles relaxed to further stretch the canal.   9. Never force the dilator into the canal.  10. 1-2 times per day   [facility] [address] [place], [place] [address] Phone # [phone] Fax [phone]

## 2018-08-04 NOTE — Therapy (Signed)
[facility] [address], [address] [place], [place], [address] Phone: [phone]   Fax:  [phone]  Physical Therapy Treatment  Patient Details  Name: Danielle Ponce MRN: [id number] Date of Birth: 18-Jan-1996 Referring Provider (PT): Dr. Hinton LovelyLibby Edwards   Encounter Date: 08/04/2018  PT End of Session - 08/04/18 0844    Visit Number  7    Date for PT Re-Evaluation  10/03/18    Authorization Type  [employer]    Authorization - Visit Number  2    Authorization - Number of Visits  20    PT Start Time  0800    PT Stop Time  0840    PT Time Calculation (min)  40 min    Activity Tolerance  Patient tolerated treatment well;No increased pain    Behavior During Therapy  WFL for tasks assessed/performed       Past Medical History:  Diagnosis Date  . Kidney disease 2001    Past Surgical History:  Procedure Laterality Date  . birth mark removal  2000  . TONSILLECTOMY  2004  . WISDOM TOOTH EXTRACTION  2011    There were no vitals filed for this visit.  Subjective Assessment - 08/04/18 0803    Subjective  Pain with q-tips 2/10. I have the dilators.     Patient Stated Goals  no pain with intercourse    Currently in Pain?  Yes    Pain Score  8     Pain Location  Vagina    Pain Orientation  Mid    Pain Descriptors / Indicators  Sharp    Pain Type  Chronic pain    Pain Onset  More than a month ago    Pain Frequency  Intermittent    Aggravating Factors   intercourse and vaginal exam    Pain Relieving Factors  no intercourse    Multiple Pain Sites  No                       [facility] Adult PT Treatment/Exercise - 08/04/18 0001      Self-Care   Self-Care  Other Self-Care Comments    Other Self-Care Comments   how to use the dilator outside the vulva area in circular fashion      Manual Therapy   Manual Therapy  Internal Pelvic Floor;Myofascial release    Myofascial Release  myofascial release along the inner thigh  to the perineum to elongate the tissue. after manual work able to press deeper into the pelvic floor muscles due to elongation    Internal Pelvic Floor  along the perineal body, sides of introitus, using desert harvest revelum to reduce pain             PT Education - 08/04/18 0843    Education Details  information on using the dilator and gave samples of [employer] Revelum    Person(s) Educated  Patient    Methods  Explanation;Demonstration;Handout    Comprehension  Verbalized understanding       PT Short Term Goals - 07/28/18 0942      PT SHORT TERM GOAL #2   Title  ability to insert q-tip into the vaginal canal without /= 2/10 pain    Baseline  1/10    Time  4    Period  Weeks    Status  Achieved      PT SHORT TERM GOAL #3   Title  understand how to perfrom  vaginal massage to relax the muscles of the pelvic floor    Time  4    Period  Weeks    Status  Achieved      PT SHORT TERM GOAL #5   Title  understand how to use a vaginal dilator to expand the vaginal canal    Baseline  going to order the dilators    Time  4    Period  Weeks    Status  On-going        PT Long Term Goals - 05/16/18 7048      PT LONG TERM GOAL #1   Title  independent with HEP and understand how to progress herself    Time  8    Period  Weeks    Status  New    Target Date  07/11/18      PT LONG TERM GOAL #2   Title  reduce pain to minimal to no pain with penile penetration so Marinoff scale is </= 0/3    Time  8    Period  Weeks    Status  New    Target Date  07/11/18      PT LONG TERM GOAL #3   Title  reduce pain after penile penetration less than 1/10     Time  8    Period  Weeks    Status  New    Target Date  07/11/18      PT LONG TERM GOAL #4   Title  abilty to use the largest dilator to continue the expansion of the vaginal canal    Time  8    Period  Weeks    Status  New    Target Date  07/11/18            Plan - 08/04/18 0844    Clinical Impression  Statement  Patient has gotten the dilators and will be using them outside on the vulva. Patient was able to tolerate internal soft tissue work to the introitus, bulbocavernosus, and ischiocavernosus without pain greater than 2/10. Patient will benefit from skilled therapy to improve tissue mobility of the perineum for penile penetration and vaginal exam.     Rehab Potential  Excellent    Clinical Impairments Affecting Rehab Potential  Vulvodynia    PT Frequency  1x / week    PT Duration  8 weeks    PT Treatment/Interventions  Biofeedback;Electrical Stimulation;Moist Heat;Therapeutic exercise;Therapeutic activities;Neuromuscular re-education;Patient/family education;Manual techniques;Dry needling    PT Next Visit Plan  work with dilators, internal soft tissue work, dry needle hip adductors    PT Home Exercise Plan  Access Code: [email or web address]    Consulted and Agree with Plan of Care  Patient       Patient will benefit from skilled therapeutic intervention in order to improve the following deficits and impairments:  Increased fascial restricitons, Pain, Decreased coordination, Increased muscle spasms, Decreased activity tolerance, Decreased endurance, Decreased strength  Visit Diagnosis: Muscle weakness (generalized)  Cramp and spasm     Problem List Patient Active Problem List   Diagnosis Date Noted  . Chronic kidney disease 01/22/2012  . Proteinuria 01/22/2012  . Reflux nephropathy 01/22/2012    Danielle Ponce, PT 08/04/18 8:47 AM   [facility] [address] [place], [place], [address] Phone: [phone]   Fax:  [phone]  Name: Danielle Ponce MRN: [id number] Date of Birth: 1995/10/12

## 2018-08-07 ENCOUNTER — Encounter: Payer: Self-pay | Admitting: Family Medicine

## 2018-08-07 ENCOUNTER — Ambulatory Visit (INDEPENDENT_AMBULATORY_CARE_PROVIDER_SITE_OTHER): Payer: BLUE CROSS/BLUE SHIELD

## 2018-08-07 ENCOUNTER — Ambulatory Visit: Payer: BLUE CROSS/BLUE SHIELD | Admitting: Family Medicine

## 2018-08-07 ENCOUNTER — Other Ambulatory Visit: Payer: Self-pay

## 2018-08-07 VITALS — BP 112/71 | HR 98 | Temp 98.4°F | Resp 14 | Ht 65.0 in | Wt 122.0 lb

## 2018-08-07 DIAGNOSIS — R3129 Other microscopic hematuria: Secondary | ICD-10-CM | POA: Diagnosis not present

## 2018-08-07 DIAGNOSIS — R0789 Other chest pain: Secondary | ICD-10-CM

## 2018-08-07 DIAGNOSIS — R5383 Other fatigue: Secondary | ICD-10-CM | POA: Diagnosis not present

## 2018-08-07 DIAGNOSIS — Z789 Other specified health status: Secondary | ICD-10-CM

## 2018-08-07 DIAGNOSIS — D72829 Elevated white blood cell count, unspecified: Secondary | ICD-10-CM | POA: Diagnosis not present

## 2018-08-07 DIAGNOSIS — R079 Chest pain, unspecified: Secondary | ICD-10-CM

## 2018-08-07 DIAGNOSIS — N189 Chronic kidney disease, unspecified: Secondary | ICD-10-CM | POA: Diagnosis not present

## 2018-08-07 LAB — POCT CBC
Granulocyte percent: 74.5 %G (ref 37–80)
HCT, POC: 33.6 % (ref 29–41)
Hemoglobin: 12.3 g/dL (ref 11–14.6)
LYMPH, POC: 2.5 (ref 0.6–3.4)
MCH, POC: 33 pg — AB (ref 27–31.2)
MCHC: 36.7 g/dL — AB (ref 31.8–35.4)
MCV: 89.9 fL (ref 76–111)
MID (cbc): 0.5 (ref 0–0.9)
MPV: 8.3 fL (ref 0–99.8)
POC Granulocyte: 8.5 — AB (ref 2–6.9)
POC LYMPH PERCENT: 21.5 %L (ref 10–50)
POC MID %: 4 %M (ref 0–12)
Platelet Count, POC: 190 10*3/uL (ref 142–424)
RBC: 3.73 M/uL — AB (ref 4.04–5.48)
RDW, POC: 12.8 %
WBC: 11.4 10*3/uL — AB (ref 4.6–10.2)

## 2018-08-07 LAB — POCT URINALYSIS DIP (MANUAL ENTRY)
Bilirubin, UA: NEGATIVE
Glucose, UA: NEGATIVE mg/dL
Ketones, POC UA: NEGATIVE mg/dL
NITRITE UA: POSITIVE — AB
PH UA: 5.5 (ref 5.0–8.0)
Spec Grav, UA: 1.015 (ref 1.010–1.025)
Urobilinogen, UA: 0.2 E.U./dL

## 2018-08-07 LAB — POC MICROSCOPIC URINALYSIS (UMFC): Mucus: ABSENT

## 2018-08-07 MED ORDER — NITROFURANTOIN MONOHYD MACRO 100 MG PO CAPS: 100.0000 mg | ORAL_CAPSULE | Freq: Two times a day (BID) | ORAL | 0 refills | Status: DC

## 2018-08-07 NOTE — Progress Notes (Addendum)
I, Danielle Ponce, acting as a Neurosurgeonscribe for Danielle FloodJeffrey R Ivyana Locey, MD, have documented all relevant documentation on the behalf of Danielle FloodJeffrey R Flower Franko, MD, as directed by Danielle FloodJeffrey R Monda Chastain, MD while in the presence of Danielle FloodJeffrey R Emile Ringgenberg, MD.   Subjective:    Patient ID: Danielle Ponce, female    DOB: October 23, 1995, 23 y.o.   MRN: [id number]  Chief Complaint  Patient presents with  . Chest Pain    constant rib pain on right side and feel tired x 4 days. Went to the Urgent care they checked me for kidney infection, strep, flu and pnuemonia and all test was neg but they did not check for mono and i think it may be mono     HPI Danielle Ponce is a 23 y.o. female who presents to Primary Care at [facility] complaining of chest pain.   Hx of anxiety.   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  Dx at age [age].  Has every 6 month follow up. Medications: enalapril 10 mg pd, Desipramine Last checked around 6 months ago.  Lab Results  Component Value Date   CREATININE 1.73 (H) 05/01/2018    Constant sharp pain for 5 days.  Body aches that come and go.  Pt described symptoms to a family member that is a Engineer, civil (consulting)nurse, who said she might have Mono.  She started a new arm workout, but there are no known injuries. She began this routine about a month ago.   Saw urgent care 3 days ago, who tested for flu, kidney infection, strep, and pneumonia- reportedly all negative.  Chest Xray performed.  Urgent care did not draw blood.   No Hx of UTI No recent calf swelling, but did have prolonged driving to [place] over Christmas On ocp, no smoking.  No hx DVT/PE.    Review of Systems  Constitutional: Positive for fatigue.  HENT: Negative for sore throat.   Respiratory: Negative for cough.   Gastrointestinal: Negative for nausea and vomiting.  Genitourinary: Negative for dysuria, frequency and urgency.   Vitals:   08/07/18 1024  BP: 112/71  Pulse: 98  Resp: 14  Temp: 98.4 F (36.9 C)  TempSrc: Oral    SpO2: 100%  Weight: 122 lb (55.3 kg)  Height: 5\' 5"  (1.651 m)       Objective:   Physical Exam HENT:     Mouth/Throat:     Pharynx: No posterior oropharyngeal erythema.  Pulmonary:     Breath sounds: Normal breath sounds. No wheezing, rhonchi or rales.  Abdominal:     General: There is no distension.     Palpations: Abdomen is soft.  Musculoskeletal:     Cervical back: She exhibits no tenderness.     Thoracic back: She exhibits tenderness (Tender along the right lower rib margin at the posterior axillary line to mid axillary.).  Skin:    Findings: No erythema or rash.    Results for orders placed or performed in visit on 08/07/18  POCT CBC  Result Value Ref Range   WBC 11.4 (A) 4.6 - 10.2 K/uL   Lymph, poc 2.5 0.6 - 3.4   POC LYMPH PERCENT 21.5 10 - 50 %L   MID (cbc) 0.5 0 - 0.9   POC MID % 4.0 0 - 12 %M   POC Granulocyte 8.5 (A) 2 - 6.9   Granulocyte percent 74.5 37 - 80 %G   RBC 3.73 (A) 4.04 - 5.48 M/uL   Hemoglobin 12.3  11 - 14.6 g/dL   HCT, POC 38.2 29 - 41 %   MCV 89.9 76 - 111 fL   MCH, POC 33.0 (A) 27 - 31.2 pg   MCHC 36.7 (A) 31.8 - 35.4 g/dL   RDW, POC 50.5 %   Platelet Count, POC 190 142 - 424 K/uL   MPV 8.3 0 - 99.8 fL  POCT urinalysis dipstick  Result Value Ref Range   Color, UA yellow yellow   Clarity, UA clear clear   Glucose, UA negative negative mg/dL   Bilirubin, UA negative negative   Ketones, POC UA negative negative mg/dL   Spec Grav, UA 3.976 7.341 - 1.025   Blood, UA trace-intact (A) negative   pH, UA 5.5 5.0 - 8.0   Protein Ur, POC =30 (A) negative mg/dL   Urobilinogen, UA 0.2 0.2 or 1.0 E.U./dL   Nitrite, UA Positive (A) Negative   Leukocytes, UA Trace (A) Negative  POCT Microscopic Urinalysis ([facility])  Result Value Ref Range   WBC,UR,HPF,POC None None WBC/hpf   RBC,UR,HPF,POC Few (A) None RBC/hpf   Bacteria Many (A) None, Too numerous to count   Mucus Absent Absent   Epithelial Cells, UR Per Microscopy Few (A) None, Too numerous  to count cells/hpf   Xray: Dg Chest 2 View  Result Date: 08/07/2018 CLINICAL DATA:  Chest pain and leukocytosis EXAM: CHEST - 2 VIEW COMPARISON:  None. FINDINGS: Lungs are clear. The heart size and pulmonary vascularity are normal. No adenopathy. No bone lesions. No pneumothorax. IMPRESSION: No edema or consolidation. Electronically Signed   By: Bretta Bang III M.D.   On: 08/07/2018 12:33        Assessment & Plan:    [name] Gumbs is a 23 y.o. female Tired - Plan: CANCELED: Mono (Epstein Barr Virus), CANCELED: Epstein-Barr Virus VCA Antibody Panel  Fatigue, unspecified type - Plan: POCT CBC, Basic metabolic panel, POCT urinalysis dipstick, POCT Microscopic Urinalysis ([facility]), DG Chest 2 View, CANCELED: D-dimer, quantitative (not at [facility])  Chest wall pain - Plan: DG Chest 2 View  Chronic kidney disease, unspecified CKD stage - Plan: Basic metabolic panel, POCT urinalysis dipstick, POCT Microscopic Urinalysis ([facility])  Left-sided chest pain - Plan: DG Chest 2 View, CANCELED: D-dimer, quantitative (not at [facility])  History of recent travel - Plan: CANCELED: D-dimer, quantitative (not at [facility])  Leukocytosis, unspecified type - Plan: POCT urinalysis dipstick, POCT Microscopic Urinalysis ([facility]), nitrofurantoin, macrocrystal-monohydrate, (MACROBID) 100 MG capsule  Other microscopic hematuria - Plan: Urine Culture, nitrofurantoin, macrocrystal-monohydrate, (MACROBID) 100 MG capsule  Fatigue, generalized malaise as above.  Slight leukocytosis without known source.  Chest x-ray reassuring.  Urinalysis suspicious for infection, and history of chronic kidney disease.  No known risk factors in the past for DVT/PE, and only recent risk factor with increased travel but no calf pain/swelling.  Chest pain likely chest wall pain/muscular pain with exercises.  -Start Macrobid for possible urinary tract infection, check renal function, check urine culture.  -Fluids, rest.  Activity modification for  exercise to see if that will help chest wall pain.  -Recheck if not improving, ER precautions given if any acute worsening symptoms.  Understanding expressed  Meds ordered this encounter  Medications  . nitrofurantoin, macrocrystal-monohydrate, (MACROBID) 100 MG capsule    Sig: Take 1 capsule (100 mg total) by mouth 2 (two) times daily.    Dispense:  14 capsule    Refill:  0   Patient Instructions   X-ray looked okay.  Infection fighting cells were a  few points above normal.  Urine test indicated possible infection, so can start macrobid, but I will check a urine culture.  I also check some other electrolyte tests including your kidney function. Make sure to drink plenty of fluids, rest as needed the next few days, follow-up if any worsening symptoms including abdominal pain, fevers, or worsening chest pain.  Chest soreness is likely due to muscular or chest wall pain.  See information below.  Avoid arm elevation exercises for now and see if that improves over the next week.  If not improving, return for recheck.  Return to the clinic or go to the nearest emergency room if any of your symptoms worsen or new symptoms occur.   Chest Wall Pain Chest wall pain is pain in or around the bones and muscles of your chest. Sometimes, an injury causes this pain. Excessive coughing or overuse of arm and chest muscles may also cause chest wall pain. Sometimes, the cause may not be known. This pain may take several weeks or longer to get better. Follow these instructions at home: Managing pain, stiffness, and swelling   If directed, put ice on the painful area: ? Put ice in a plastic bag. ? Place a towel between your skin and the bag. ? Leave the ice on for 20 minutes, 2-3 times per day. Activity  Rest as told by your health care provider.  Avoid activities that cause pain. These include any activities that use your chest muscles or your abdominal and side muscles to lift heavy items. Ask your  health care provider what activities are safe for you. General instructions   Take over-the-counter and prescription medicines only as told by your health care provider.  Do not use any products that contain nicotine or tobacco, such as cigarettes, e-cigarettes, and chewing tobacco. These can delay healing after injury. If you need help quitting, ask your health care provider.  Keep all follow-up visits as told by your health care provider. This is important. Contact a health care provider if:  You have a fever.  Your chest pain becomes worse.  You have new symptoms. Get help right away if:  You have nausea or vomiting.  You feel sweaty or light-headed.  You have a cough with mucus from your lungs (sputum) or you cough up blood.  You develop shortness of breath. These symptoms may represent a serious problem that is an emergency. Do not wait to see if the symptoms will go away. Get medical help right away. Call your local emergency services (911 in the [place] Do not drive yourself to the hospital. Summary  Chest wall pain is pain in or around the bones and muscles of your chest.  Depending on the cause, it may be treated with ice, rest, medicines, and avoiding activities that cause pain.  Contact a health care provider if you have a fever, worsening chest pain, or new symptoms.  Get help right away if you feel light-headed or you develop shortness of breath. These symptoms may be an emergency. This information is not intended to replace advice given to you by your health care provider. Make sure you discuss any questions you have with your health care provider. Document Released: 07/05/2005 Document Revised: 01/05/2018 Document Reviewed: 01/05/2018 Elsevier Interactive Patient Education  [employer]2019 Elsevier Inc.   If you have lab work done today you will be contacted with your lab results within the next 2 weeks.  If you have not heard from us then please contact us.  The fastest  way to get your results is to register for My Chart.   IF you received an x-ray today, you will receive an invoice from [facility]. Please contact [facility] at [phone] with questions or concerns regarding your invoice.   IF you received labwork today, you will receive an invoice from [facility]. Please contact [facility] at [phone] with questions or concerns regarding your invoice.   Our billing staff will not be able to assist you with questions regarding bills from these companies.  You will be contacted with the lab results as soon as they are available. The fastest way to get your results is to activate your My Chart account. Instructions are located on the last page of this paperwork. If you have not heard from [place] regarding the results in 2 weeks, please contact this office.       I personally performed the services described in this documentation, which was scribed in my presence. The recorded information has been reviewed and considered for accuracy and completeness, addended by me as needed, and agree with information above.  Signed,   Meredith Staggers, MD Primary Care at [facility].  08/08/18 8:28 PM

## 2018-08-07 NOTE — Patient Instructions (Addendum)
X-ray looked okay.  Infection fighting cells were a few points above normal.  Urine test indicated possible infection, so can start macrobid, but I will check a urine culture.  I also check some other electrolyte tests including your kidney function. Make sure to drink plenty of fluids, rest as needed the next few days, follow-up if any worsening symptoms including abdominal pain, fevers, or worsening chest pain.  Chest soreness is likely due to muscular or chest wall pain.  See information below.  Avoid arm elevation exercises for now and see if that improves over the next week.  If not improving, return for recheck.  Return to the clinic or go to the nearest emergency room if any of your symptoms worsen or new symptoms occur.   Chest Wall Pain Chest wall pain is pain in or around the bones and muscles of your chest. Sometimes, an injury causes this pain. Excessive coughing or overuse of arm and chest muscles may also cause chest wall pain. Sometimes, the cause may not be known. This pain may take several weeks or longer to get better. Follow these instructions at home: Managing pain, stiffness, and swelling   If directed, put ice on the painful area: ? Put ice in a plastic bag. ? Place a towel between your skin and the bag. ? Leave the ice on for 20 minutes, 2-3 times per day. Activity  Rest as told by your health care provider.  Avoid activities that cause pain. These include any activities that use your chest muscles or your abdominal and side muscles to lift heavy items. Ask your health care provider what activities are safe for you. General instructions   Take over-the-counter and prescription medicines only as told by your health care provider.  Do not use any products that contain nicotine or tobacco, such as cigarettes, e-cigarettes, and chewing tobacco. These can delay healing after injury. If you need help quitting, ask your health care provider.  Keep all follow-up visits as  told by your health care provider. This is important. Contact a health care provider if:  You have a fever.  Your chest pain becomes worse.  You have new symptoms. Get help right away if:  You have nausea or vomiting.  You feel sweaty or light-headed.  You have a cough with mucus from your lungs (sputum) or you cough up blood.  You develop shortness of breath. These symptoms may represent a serious problem that is an emergency. Do not wait to see if the symptoms will go away. Get medical help right away. Call your local emergency services (911 in the [place] Do not drive yourself to the hospital. Summary  Chest wall pain is pain in or around the bones and muscles of your chest.  Depending on the cause, it may be treated with ice, rest, medicines, and avoiding activities that cause pain.  Contact a health care provider if you have a fever, worsening chest pain, or new symptoms.  Get help right away if you feel light-headed or you develop shortness of breath. These symptoms may be an emergency. This information is not intended to replace advice given to you by your health care provider. Make sure you discuss any questions you have with your health care provider. Document Released: 07/05/2005 Document Revised: 01/05/2018 Document Reviewed: 01/05/2018 Elsevier Interactive Patient Education  [employer]2019 Elsevier Inc.   If you have lab work done today you will be contacted with your lab results within the next 2 weeks.  If you  have not heard from us then please contact us. The fastest way to get your results is to register for My Chart.   IF you received an x-ray today, you will receive an invoice from [facility]. Please contact [facility] at [phone] with questions or concerns regarding your invoice.   IF you received labwork today, you will receive an invoice from [facility]. Please contact [facility] at [phone] with questions or concerns regarding your invoice.    Our billing staff will not be able to assist you with questions regarding bills from these companies.  You will be contacted with the lab results as soon as they are available. The fastest way to get your results is to activate your My Chart account. Instructions are located on the last page of this paperwork. If you have not heard from us regarding the results in 2 weeks, please contact this office.

## 2018-08-08 ENCOUNTER — Encounter: Payer: Self-pay | Admitting: Family Medicine

## 2018-08-08 LAB — BASIC METABOLIC PANEL
BUN/Creatinine Ratio: 23 (ref 9–23)
BUN: 43 mg/dL — ABNORMAL HIGH (ref 6–20)
CO2: 21 mmol/L (ref 20–29)
Calcium: 9.2 mg/dL (ref 8.7–10.2)
Chloride: 102 mmol/L (ref 96–106)
Creatinine, Ser: 1.88 mg/dL — ABNORMAL HIGH (ref 0.57–1.00)
GFR calc Af Amer: 42 mL/min/{1.73_m2} — ABNORMAL LOW (ref >59)
GFR calc non Af Amer: 37 mL/min/{1.73_m2} — ABNORMAL LOW (ref >59)
Glucose: 68 mg/dL (ref 65–99)
POTASSIUM: 4.8 mmol/L (ref 3.5–5.2)
Sodium: 140 mmol/L (ref 134–144)

## 2018-08-09 LAB — URINE CULTURE

## 2018-08-10 ENCOUNTER — Ambulatory Visit: Payer: BLUE CROSS/BLUE SHIELD | Admitting: Physical Therapy

## 2018-08-17 ENCOUNTER — Ambulatory Visit: Payer: BLUE CROSS/BLUE SHIELD | Admitting: Physical Therapy

## 2018-08-17 ENCOUNTER — Encounter: Payer: Self-pay | Admitting: Physical Therapy

## 2018-08-17 DIAGNOSIS — M6281 Muscle weakness (generalized): Secondary | ICD-10-CM

## 2018-08-17 DIAGNOSIS — R252 Cramp and spasm: Secondary | ICD-10-CM | POA: Diagnosis not present

## 2018-08-17 NOTE — Patient Instructions (Addendum)
PROTOCOL FOR DILATORS   1. Wash dilator with soap and water prior to insertion.    2. Lay on your back reclined. Knees are to be up and apart while on your bed or in the bathtub with warm water.   3. Lubricate the end of the dilator with a water-soluble lubricant.  4. Separate the labia.   5. Tense the pelvic floor muscles than relax; while relaxing, slide lubricated dilator into the vagina.    6. Tense muscles again while holding the dilator so it does not get pushed out; relax and slide it in a little further.   7. Try blowing out as if filling a balloon; this may relax the muscles and allow penetration.  Repeat blowing out to insert dilator further.  8. Keep dilator in for 10 minutes if tolerate, with the pelvic floor muscles relaxed to further stretch the canal.   9. Never force the dilator into the canal.  10. 1-2 times per day Trigger Point Dry Needling  . What is Trigger Point Dry Needling (DN)? o DN is a physical therapy technique used to treat muscle pain and dysfunction. Specifically, DN helps deactivate muscle trigger points (muscle knots).  o A thin filiform needle is used to penetrate the skin and stimulate the underlying trigger point. The goal is for a local twitch response (LTR) to occur and for the trigger point to relax. No medication of any kind is injected during the procedure.   . What Does Trigger Point Dry Needling Feel Like?  o The procedure feels different for each individual patient. Some patients report that they do not actually feel the needle enter the skin and overall the process is not painful. Very mild bleeding may occur. However, many patients feel a deep cramping in the muscle in which the needle was inserted. This is the local twitch response.   Marland Kitchen How Will I feel after the treatment? o Soreness is normal, and the onset of soreness may not occur for a few hours. Typically this soreness does not last longer than two days.  o Bruising is uncommon,  however; ice can be used to decrease any possible bruising.  o In rare cases feeling tired or nauseous after the treatment is normal. In addition, your symptoms may get worse before they get better, this period will typically not last longer than 24 hours.   . What Can I do After My Treatment? o Increase your hydration by drinking more water for the next 24 hours. o You may place ice or heat on the areas treated that have become sore, however, do not use heat on inflamed or bruised areas. Heat often brings more relief post needling. o You can continue your regular activities, but vigorous activity is not recommended initially after the treatment for 24 hours. o DN is best combined with other physical therapy such as strengthening, stretching, and other therapies.    [facility] [address], [address] [place], [place] [address] Phone # [phone] Fax [phone]  The "Pelvic Drop" to Release Pelvic Floor Tension: Three Visualizations  [facility] [address], [address] [place], [place] [address] Phone # [phone] Fax [phone]

## 2018-08-17 NOTE — Therapy (Signed)
[facility] [address] [place], [place], [address] Phone: [phone]   Fax:  [phone]  Physical Therapy Treatment  Patient Details  Name: Danielle Ponce MRN: [id number] Date of Birth: August 02, 1995 Referring Provider (PT): Dr. Hinton Lovely   Encounter Date: 08/17/2018  PT End of Session - 08/17/18 0804    Visit Number  8    Date for PT Re-Evaluation  10/03/18    Authorization Type  BC[employer]    Authorization - Visit Number  3    Authorization - Number of Visits  20    PT Start Time  0800    PT Stop Time  0840    PT Time Calculation (min)  40 min    Activity Tolerance  Patient tolerated treatment well;No increased pain    Behavior During Therapy  WFL for tasks assessed/performed       Past Medical History:  Diagnosis Date  . Kidney disease 2001    Past Surgical History:  Procedure Laterality Date  . birth mark removal  2000  . TONSILLECTOMY  2004  . WISDOM TOOTH EXTRACTION  2011    There were no vitals filed for this visit.                    [facility] Adult PT Treatment/Exercise - 08/17/18 0001      Self-Care   Self-Care  Other Self-Care Comments    Other Self-Care Comments   how to use the dilators she bought and brought in using a pelvic floor model      Neuro Re-ed    Neuro Re-ed Details   diaphragmatic breathing with pelvic floor drop and understandingwhen the pelvic floor is relaxed      Manual Therapy   Manual Therapy  Soft tissue mobilization    Soft tissue mobilization  bilateral hip adductors       Trigger Point Dry Needling - 08/17/18 0841    Consent Given?  Yes    Education Handout Provided  Yes    Muscles Treated Lower Body  Adductor longus/brevius/maximus    Adductor Response  Twitch response elicited;Palpable increased muscle length           PT Education - 08/17/18 0838    Education Details  pelvic floor drop; how to use vaginal dilator, information on dry  needling; gave samples of slippery stuff    Person(s) Educated  Patient    Methods  Explanation;Demonstration;Verbal cues;Handout    Comprehension  Returned demonstration;Verbalized understanding       PT Short Term Goals - 07/28/18 0942      PT SHORT TERM GOAL #2   Title  ability to insert q-tip into the vaginal canal without /= 2/10 pain    Baseline  1/10    Time  4    Period  Weeks    Status  Achieved      PT SHORT TERM GOAL #3   Title  understand how to perfrom vaginal massage to relax the muscles of the pelvic floor    Time  4    Period  Weeks    Status  Achieved      PT SHORT TERM GOAL #5   Title  understand how to use a vaginal dilator to expand the vaginal canal    Baseline  going to order the dilators    Time  4    Period  Weeks    Status  On-going  PT Long Term Goals - 08/17/18 0845      PT LONG TERM GOAL #1   Title  independent with HEP and understand how to progress herself    Time  8    Period  Weeks    Status  On-going      PT LONG TERM GOAL #2   Title  reduce pain to minimal to no pain with penile penetration so Marinoff scale is </= 0/3    Time  8    Period  Weeks    Status  On-going      PT LONG TERM GOAL #3   Title  reduce pain after penile penetration less than 1/10     Time  8    Period  Weeks    Status  On-going      PT LONG TERM GOAL #4   Title  abilty to use the largest dilator to continue the expansion of the vaginal canal    Time  8    Period  Weeks    Status  On-going            Plan - 08/17/18 0805    Clinical Impression Statement  Patient brought the dilators in but is on her cycle so we did not use them today. Patient understands how to use them and progression. Patient had trigger points in the pelvic floor muscles. Patient had some difficulty with diaphragmatic breathing and needed verbal cues. Patient will benefit from skilled therapy to improve tissue mobility of the perineum for penile penetration and vaginal  exam.     Rehab Potential  Excellent    Clinical Impairments Affecting Rehab Potential  Vulvodynia    PT Frequency  1x / week    PT Duration  8 weeks    PT Treatment/Interventions  Biofeedback;Electrical Stimulation;Moist Heat;Therapeutic exercise;Therapeutic activities;Neuromuscular re-education;Patient/family education;Manual techniques;Dry needling    PT Next Visit Plan  work with dilators, internal soft tissue work, work with pelvic floor EMG to relax the pelvic floor    PT Home Exercise Plan  Access Code: [email or web address]    Consulted and Agree with Plan of Care  Patient       Patient will benefit from skilled therapeutic intervention in order to improve the following deficits and impairments:  Increased fascial restricitons, Pain, Decreased coordination, Increased muscle spasms, Decreased activity tolerance, Decreased endurance, Decreased strength  Visit Diagnosis: Muscle weakness (generalized)  Cramp and spasm     Problem List Patient Active Problem List   Diagnosis Date Noted  . Chronic kidney disease 01/22/2012  . Proteinuria 01/22/2012  . Reflux nephropathy 01/22/2012    Eulis Foster, PT 08/17/18 8:46 AM   [facility] [address] [place], [place], [address] Phone: [phone]   Fax:  [phone]  Name: Danielle Ponce MRN: [id number] Date of Birth: Aug 10, 1995

## 2018-08-25 ENCOUNTER — Encounter: Payer: Self-pay | Admitting: Physical Therapy

## 2018-08-25 ENCOUNTER — Ambulatory Visit: Payer: BLUE CROSS/BLUE SHIELD | Attending: Dermatology | Admitting: Physical Therapy

## 2018-08-25 DIAGNOSIS — M6281 Muscle weakness (generalized): Secondary | ICD-10-CM

## 2018-08-25 DIAGNOSIS — R252 Cramp and spasm: Secondary | ICD-10-CM | POA: Diagnosis not present

## 2018-08-25 NOTE — Therapy (Signed)
[facility] [address] [place], [place], [address] Phone: [phone]   Fax:  [phone]  Physical Therapy Treatment  Patient Details  Name: Danielle Ponce MRN: [id number] Date of Birth: 10/26/1995 Referring Provider (PT): Dr. Hinton Lovely   Encounter Date: 08/25/2018  PT End of Session - 08/25/18 0804    Visit Number  9    Date for PT Re-Evaluation  10/03/18    Authorization Type  BCBS    Authorization - Visit Number  4    Authorization - Number of Visits  20    PT Start Time  0800    PT Stop Time  0840    PT Time Calculation (min)  40 min    Activity Tolerance  Patient tolerated treatment well;No increased pain    Behavior During Therapy  WFL for tasks assessed/performed       Past Medical History:  Diagnosis Date  . Kidney disease 2001    Past Surgical History:  Procedure Laterality Date  . birth mark removal  2000  . TONSILLECTOMY  2004  . WISDOM TOOTH EXTRACTION  2011    There were no vitals filed for this visit.  Subjective Assessment - 08/25/18 0805    Subjective  I used the smallest dilator and pain level was 2/10.     Patient Stated Goals  no pain with intercourse    Currently in Pain?  Yes    Pain Score  8     Pain Location  Vagina    Pain Orientation  Mid    Pain Descriptors / Indicators  Sharp    Pain Type  Chronic pain    Pain Onset  More than a month ago    Pain Frequency  Intermittent    Aggravating Factors   intercourse and vaginal exam    Pain Relieving Factors  no intercourse    Multiple Pain Sites  No                    Pelvic Floor Special Questions - 08/25/18 0001    Biofeedback  using the dilators with the relaxation program smile face to relax the pelvic floor, use the smallest dilator and move hips made the pelvic floor go abouf 5 uv, using the relaxation mediation video to relax the pelvic floor with the dilator in after the resting tone increased    Biofeedback sensor type  Surface   vaginal               PT Education - 08/25/18 0839    Education Details  how to relax the pelvic floor with the dilators in, how to progress the dilators, how to move her hips with dilator in    Person(s) Educated  Patient    Methods  Explanation;Demonstration    Comprehension  Verbalized understanding;Returned demonstration       PT Short Term Goals - 08/25/18 0843      PT SHORT TERM GOAL #5   Title  understand how to use a vaginal dilator to expand the vaginal canal    Time  4    Period  Weeks    Status  Achieved        PT Long Term Goals - 08/17/18 0845      PT LONG TERM GOAL #1   Title  independent with HEP and understand how to progress herself    Time  8    Period  Weeks  Status  On-going      PT LONG TERM GOAL #2   Title  reduce pain to minimal to no pain with penile penetration so Marinoff scale is </= 0/3    Time  8    Period  Weeks    Status  On-going      PT LONG TERM GOAL #3   Title  reduce pain after penile penetration less than 1/10     Time  8    Period  Weeks    Status  On-going      PT LONG TERM GOAL #4   Title  abilty to use the largest dilator to continue the expansion of the vaginal canal    Time  8    Period  Weeks    Status  On-going            Plan - 08/25/18 4158    Clinical Impression Statement  Patient is able to relax the pelvic floor to 1.5uv with the first 2 size dilators. When she moves the first dilator with the hips her resting tone will increase to 5 uv. Patient is able to demonstrate how to use the dilators correctly and relax with the dilator in. Patient will benefit from skilled therapy to imporve tissue mobility of the perineum for penile penetration and vaginal  exam.    Rehab Potential  Excellent    Clinical Impairments Affecting Rehab Potential  Vulvodynia    PT Frequency  1x / week    PT Duration  8 weeks    PT Treatment/Interventions  Biofeedback;Electrical  Stimulation;Moist Heat;Therapeutic exercise;Therapeutic activities;Neuromuscular re-education;Patient/family education;Manual techniques;Dry needling    PT Next Visit Plan  work with dilators, internal soft tissue work, work with pelvic floor EMG to relax the pelvic floor    PT Home Exercise Plan  Access Code: [email or web address]    Consulted and Agree with Plan of Care  Patient       Patient will benefit from skilled therapeutic intervention in order to improve the following deficits and impairments:  Increased fascial restricitons, Pain, Decreased coordination, Increased muscle spasms, Decreased activity tolerance, Decreased endurance, Decreased strength  Visit Diagnosis: Muscle weakness (generalized)  Cramp and spasm     Problem List Patient Active Problem List   Diagnosis Date Noted  . Chronic kidney disease 01/22/2012  . Proteinuria 01/22/2012  . Reflux nephropathy 01/22/2012    Eulis Foster, PT 08/25/18 8:44 AM   [facility] [address], [address] [place], [place], [address] Phone: [phone]   Fax:  [phone]  Name: Danielle Ponce MRN: [id number] Date of Birth: 1995-10-08

## 2018-09-01 ENCOUNTER — Encounter: Payer: BLUE CROSS/BLUE SHIELD | Admitting: Physical Therapy

## 2018-09-08 ENCOUNTER — Encounter: Payer: BLUE CROSS/BLUE SHIELD | Admitting: Physical Therapy

## 2018-09-15 ENCOUNTER — Encounter: Payer: BLUE CROSS/BLUE SHIELD | Admitting: Physical Therapy

## 2018-10-04 ENCOUNTER — Encounter: Payer: Self-pay | Admitting: Physical Therapy

## 2018-10-04 ENCOUNTER — Other Ambulatory Visit: Payer: Self-pay

## 2018-10-04 ENCOUNTER — Ambulatory Visit: Payer: BLUE CROSS/BLUE SHIELD | Attending: Dermatology | Admitting: Physical Therapy

## 2018-10-04 DIAGNOSIS — R252 Cramp and spasm: Secondary | ICD-10-CM | POA: Diagnosis not present

## 2018-10-04 DIAGNOSIS — M6281 Muscle weakness (generalized): Secondary | ICD-10-CM | POA: Diagnosis not present

## 2018-10-04 NOTE — Patient Instructions (Addendum)
Have a pillow at end of dilator to hold it in as you relax Listen to the pelvic floor meditation you tube video.  Start with the one you are on now     Place dilator in for 1 min, move the dilator around the sides of the pelvic floor, move your hips in different directions for 2 min  Then go to the next size dilator and keep it still. When you are to use the dilator without more than 3/10 pain start movement.   When you are able move the size up dilator in different directions and hip movement  Start the process over with the larger dilator  [facility] [address] [place], [place] [address] Phone # [phone] Fax [phone]

## 2018-10-04 NOTE — Therapy (Signed)
[facility] [address], [address] [place], [place], [address] Phone: [phone]   Fax:  [phone]  Physical Therapy Treatment  Patient Details  Name: Danielle Ponce MRN: [id number] Date of Birth: 1995-08-22 Referring Provider (PT): Dr. Hinton Lovely   Encounter Date: 10/04/2018  PT End of Session - 10/04/18 1540    Visit Number  10    Date for PT Re-Evaluation  02/03/19    Authorization Type  BCBS    Authorization - Visit Number  5    Authorization - Number of Visits  20    PT Start Time  1530    PT Stop Time  1615    PT Time Calculation (min)  45 min    Activity Tolerance  Patient tolerated treatment well;No increased pain    Behavior During Therapy  WFL for tasks assessed/performed       Past Medical History:  Diagnosis Date  . Kidney disease 2001    Past Surgical History:  Procedure Laterality Date  . birth mark removal  2000  . TONSILLECTOMY  2004  . WISDOM TOOTH EXTRACTION  2011    There were no vitals filed for this visit.  Subjective Assessment - 10/04/18 1530    Subjective  I am having trouble relaxing the muscles. I put the dilator in and hold it. When I let go of it, the dilator will come out. I am on the 2nd or third dilator. Pain level with the dilator is 1-2/10. Patient reports they have had intercourse 1 time per month for pain level 4/10.  Better with him on top.     Patient Stated Goals  no pain with intercourse    Currently in Pain?  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[employer]    Leisure  runner 2-3 miles 4-5 days per week on treatmill       Cognition   Overall Cognitive Status  Within Functional Limits for tasks assessed      Posture/Postural Control   Posture/Postural Control  No significant limitations      AROM   Lumbar Extension  full    Lumbar - Right Side Bend  full    Lumbar - Left Side Bend  full    Lumbar - Right Rotation  full    Lumbar - Left Rotation  full      Strength  Right Hip Extension  5/5    Right Hip ADduction  5/5    Left Hip ABduction  5/5    Left Hip ADduction  5/5      Palpation   SI assessment   pelvis in correct alignment                Pelvic Floor Special Questions - 10/04/18 0001    Currently Sexually Active  Yes    Is this Painful  Yes    History of sexually transmitted disease  No    Marinoff Scale  pain interrupts completion   can be a one depending on position   Urinary Leakage  No    Fecal incontinence  No    Skin Integrity  Intact    Pelvic Floor Internal Exam  Patient comfirms identification and approves PT to assess the pelvic floor and treatment    Exam Type  Vaginal    Palpation  no tenderness with the q-tip test; tenderness located on the puborectalis and ilicoccygeus, and introitus; end of therapy able to place two fingers in the introitu        [facility] Adult PT Treatment/Exercise - 10/04/18 0001      Self-Care   Self-Care  Other Self-Care Comments    Other Self-Care Comments   education on how to progress the dialtor with hip movement and how to progress to the next size      Manual Therapy   Manual Therapy  Internal  Pelvic Floor    Soft tissue mobilization  along the vulva area, stretching the intoitus    Internal Pelvic Floor  along the perineum, along the puborectalis, along the bulbocavernosus, along the ichiococcygeus             PT Education - 10/04/18 1624    Education Details  education on progression of the dilator    Person(s) Educated  Patient    Methods  Explanation;Handout    Comprehension  Verbalized understanding       PT Short Term Goals - 08/25/18 0843      PT SHORT TERM GOAL #5   Title  understand how to use a vaginal dilator to expand the vaginal canal    Time  4    Period  Weeks    Status  Achieved        PT Long Term Goals - 10/04/18 1635      PT LONG TERM GOAL #1   Title  independent with HEP and understand how to progress herself    Time  8    Period  Weeks    Status  On-going      PT LONG TERM GOAL #2   Title  reduce pain to minimal to no pain with penile penetration so Marinoff scale is </= 0/3    Time  8    Period  Weeks    Status  On-going      PT LONG TERM GOAL #3   Title  reduce pain after penile penetration less than 1/10     Time  8    Period  Weeks    Status  On-going      PT LONG TERM GOAL #4   Title  abilty to use the largest dilator to continue the expansion of the vaginal canal    Time  8    Period  Weeks    Status  On-going            Plan -  10/04/18 1541    Clinical Impression Statement  Lumbar ROM is full. bilateral hip strength is 5/5. Patient pelvis in correct alignment. Patient is on the second dilator with 1/10 pain. Patient is able to have intercourse with her partner on top with pain level 4/10. Patient was able to have therapist perform internal soft tissue work to the internal pelvic floor muscles with pain level no more than 1/10. Patient understands how to progress herself to the next size dilators. Patient perineum is healthy tissue. Patient will benefit from skilled therapy to improve tissue mobility of the  perineum for penile penetration and vaginal exam.     Rehab Potential  Excellent    Clinical Impairments Affecting Rehab Potential  Vulvodynia    PT Frequency  1x / week    PT Duration  Other (comment)   per month   PT Treatment/Interventions  Biofeedback;Electrical Stimulation;Moist Heat;Therapeutic exercise;Therapeutic activities;Neuromuscular re-education;Patient/family education;Manual techniques;Dry needling    PT Next Visit Plan  work with dilators, internal soft tissue work, work with pelvic floor EMG to relax the pelvic floor; see patient 1 time per month    PT Home Exercise Plan  Access Code: [email or web address]    Recommended Other Services  renewal note sent to MD on 10/04/2018    Consulted and Agree with Plan of Care  Patient       Patient will benefit from skilled therapeutic intervention in order to improve the following deficits and impairments:  Increased fascial restricitons, Pain, Decreased coordination, Increased muscle spasms, Decreased activity tolerance, Decreased endurance, Decreased strength  Visit Diagnosis: Muscle weakness (generalized) - Plan: PT plan of care cert/re-cert  Cramp and spasm - Plan: PT plan of care cert/re-cert     Problem List Patient Active Problem List   Diagnosis Date Noted  . Chronic kidney disease 01/22/2012  . Proteinuria 01/22/2012  . Reflux nephropathy 01/22/2012    Eulis Foster, PT 10/04/18 4:37 PM   [facility] [address] [place], [place], [address] Phone: [phone]   Fax:  [phone]  Name: Danielle Ponce MRN: [id number] Date of Birth: December 27, 1995

## 2018-10-30 ENCOUNTER — Ambulatory Visit: Payer: BLUE CROSS/BLUE SHIELD | Admitting: Physical Therapy

## 2018-12-14 ENCOUNTER — Ambulatory Visit: Payer: BLUE CROSS/BLUE SHIELD | Attending: Dermatology | Admitting: Physical Therapy

## 2018-12-14 ENCOUNTER — Encounter: Payer: Self-pay | Admitting: Physical Therapy

## 2018-12-14 ENCOUNTER — Other Ambulatory Visit: Payer: Self-pay

## 2018-12-14 DIAGNOSIS — R252 Cramp and spasm: Secondary | ICD-10-CM | POA: Insufficient documentation

## 2018-12-14 DIAGNOSIS — M6281 Muscle weakness (generalized): Secondary | ICD-10-CM

## 2018-12-14 NOTE — Therapy (Signed)
[facility] [address] [place], [place], [address] Phone: [phone]   Fax:  [phone]  Physical Therapy Treatment  Patient Details  Name: Danielle Ponce MRN: [id number] Date of Birth: 1996-01-12 Referring Provider (PT): Dr. Hinton LovelyLibby Edwards   Encounter Date: 12/14/2018  PT End of Session - 12/14/18 1533    Visit Number  11    Date for PT Re-Evaluation  02/03/19    Authorization Type  BCBS    Authorization - Visit Number  6    Authorization - Number of Visits  20    PT Start Time  1445    PT Stop Time  1530    PT Time Calculation (min)  45 min    Activity Tolerance  Patient tolerated treatment well;No increased pain    Behavior During Therapy  WFL for tasks assessed/performed       Past Medical History:  Diagnosis Date  . Kidney disease 2001    Past Surgical History:  Procedure Laterality Date  . birth mark removal  2000  . TONSILLECTOMY  2004  . WISDOM TOOTH EXTRACTION  2011    There were no vitals filed for this visit.  Subjective Assessment - 12/14/18 1449    Subjective  Patient is on the second to the biggest dilator with no pain. Patient has tried intercourse with pain level 2/10. He stays in the postion where it does not hurt.     Patient Stated Goals  no pain with intercourse    Currently in Pain?  Yes    Pain Score  2     Pain Location  Vagina    Pain Orientation  Mid    Pain Descriptors / Indicators  Dull    Pain Type  Chronic pain    Pain Onset  More than a month ago    Pain Frequency  Intermittent    Aggravating Factors   intercourse and vaginal exam    Pain Relieving Factors  no intercourse    Multiple Pain Sites  No                    Pelvic Floor Special Questions - 12/14/18 0001    Pelvic Floor Internal Exam  Patient comfirms identification and approves PT to assess the pelvic floor and treatment    Exam Type  Vaginal    Palpation  tightness in the introitus and  perineal body, bulbococcygeus    Strength  weak squeeze, no lift        [facility] Adult PT Treatment/Exercise - 12/14/18 0001      Self-Care   Self-Care  Other Self-Care Comments    Other Self-Care Comments   instruction on what to do with a vaginal exam with bulging the pelvic floor, doing the happy baby, piriformis stretch and belly breathing      Neuro Re-ed    Neuro Re-ed Details   bulging of the pelvic floor with diaphgramatic breathing 10 times      Manual Therapy   Manual Therapy  Internal Pelvic Floor    Internal Pelvic Floor  along the perineum, along the puborectalis, along the bulbocavernosus, along the ichiococcygeus               PT Short Term Goals - 08/25/18 [id number]      PT SHORT TERM GOAL #5   Title  understand how to use a vaginal dilator to expand the vaginal canal    Time  4  Period  Weeks    Status  Achieved        PT Long Term Goals - 12/14/18 1538      PT LONG TERM GOAL #1   Title  independent with HEP and understand how to progress herself    Time  8    Period  Weeks    Status  On-going      PT LONG TERM GOAL #2   Title  reduce pain to minimal to no pain with penile penetration so Marinoff scale is </= 0/3    Baseline  Marinoff 1/3 and pain level 2/10    Time  8    Period  Weeks    Status  On-going      PT LONG TERM GOAL #3   Title  reduce pain after penile penetration less than 1/10     Baseline  pain level 2/10    Time  8    Period  Weeks    Status  On-going      PT LONG TERM GOAL #4   Title  abilty to use the largest dilator to continue the expansion of the vaginal canal    Baseline  second to last dilator    Time  8    Period  Weeks    Status  On-going            Plan - 12/14/18 1534    Clinical Impression Statement  Patient is on the second to the last dilator with no pain. Patient able to have intercourse with pain level 2/10. Patient will be having a vaginal exam next week and was educated on how to keep the pelvic  floor relaxed. Patient is able to bulge the pelvic floor and needs sometime to fully relax. Patient has minimal tightness in the perineal body and sides of introitus. Patient will benefit from skilled therapy  to improve tissue mobility of the perineum for penile penentration and vaginal exam.     Rehab Potential  Excellent    Clinical Impairments Affecting Rehab Potential  Vulvodynia    PT Frequency  1x / week    PT Duration  2 weeks   4 months   PT Treatment/Interventions  Biofeedback;Electrical Stimulation;Moist Heat;Therapeutic exercise;Therapeutic activities;Neuromuscular re-education;Patient/family education;Manual techniques;Dry needling    PT Next Visit Plan   internal soft tissue work,  see patient 1 time per month    PT Home Exercise Plan  Access Code: [email or web address]    Recommended Other Services  MD signed all notes    Consulted and Agree with Plan of Care  Patient       Patient will benefit from skilled therapeutic intervention in order to improve the following deficits and impairments:  Increased fascial restricitons, Pain, Decreased coordination, Increased muscle spasms, Decreased activity tolerance, Decreased endurance, Decreased strength  Visit Diagnosis: Muscle weakness (generalized)  Cramp and spasm     Problem List Patient Active Problem List   Diagnosis Date Noted  . Chronic kidney disease 01/22/2012  . Proteinuria 01/22/2012  . Reflux nephropathy 01/22/2012    Eulis Foster, PT 12/14/18 3:40 PM   [facility] [address]  [place], [place], [address] Phone: [phone]   Fax:  [phone]  Name: Danielle Ponce MRN: [id number] Date of Birth: 07-10-1996

## 2018-12-14 NOTE — Patient Instructions (Signed)
When see the gynecologist  Belly breathing  Happy Baby pose  Piriformis stretch  Lying on your back butterfly stretch

## 2018-12-18 DIAGNOSIS — Z6820 Body mass index (BMI) 20.0-20.9, adult: Secondary | ICD-10-CM | POA: Diagnosis not present

## 2018-12-18 DIAGNOSIS — Z113 Encounter for screening for infections with a predominantly sexual mode of transmission: Secondary | ICD-10-CM | POA: Diagnosis not present

## 2018-12-18 DIAGNOSIS — Z01411 Encounter for gynecological examination (general) (routine) with abnormal findings: Secondary | ICD-10-CM | POA: Diagnosis not present

## 2018-12-18 DIAGNOSIS — Z3040 Encounter for surveillance of contraceptives, unspecified: Secondary | ICD-10-CM | POA: Diagnosis not present

## 2019-01-11 ENCOUNTER — Encounter: Payer: Self-pay | Admitting: Physical Therapy

## 2019-01-11 ENCOUNTER — Ambulatory Visit: Payer: BC Managed Care – PPO | Attending: Dermatology | Admitting: Physical Therapy

## 2019-01-11 ENCOUNTER — Other Ambulatory Visit: Payer: Self-pay

## 2019-01-11 DIAGNOSIS — R252 Cramp and spasm: Secondary | ICD-10-CM | POA: Diagnosis not present

## 2019-01-11 DIAGNOSIS — M6281 Muscle weakness (generalized): Secondary | ICD-10-CM

## 2019-01-11 NOTE — Therapy (Signed)
[facility] [address] [place], [place], [address] Phone: [phone]   Fax:  [phone]  Physical Therapy Treatment  Patient Details  Name: Danielle Ponce MRN: [id number] Date of Birth: 04/27/1996 Referring Provider (PT): Dr. Daniel Nones   Encounter Date: 01/11/2019  PT End of Session - 01/11/19 1611    Visit Number  12    Date for PT Re-Evaluation  02/03/19    Authorization Type  [employer]    Authorization - Visit Number  7    Authorization - Number of Visits  20    PT Start Time  1600    PT Stop Time  4431    PT Time Calculation (min)  45 min    Activity Tolerance  Patient tolerated treatment well;No increased pain    Behavior During Therapy  WFL for tasks assessed/performed       Past Medical History:  Diagnosis Date  . Kidney disease 2001    Past Surgical History:  Procedure Laterality Date  . birth mark removal  2000  . TONSILLECTOMY  2004  . WISDOM TOOTH EXTRACTION  2011    There were no vitals filed for this visit.  Subjective Assessment - 01/11/19 1604    Subjective  I am on the second to biggest. I am scared to use the biggest dilator. Intercourse is doing well.    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    Leisure  runner 2-3 miles 4-5 days per week on treatmill       Cognition   Overall Cognitive Status  Within Functional Limits for tasks assessed      AROM   Lumbar Extension  full    Lumbar - Right Side Bend  full    Lumbar - Left Side Bend  full    Lumbar - Right Rotation  full    Lumbar - Left Rotation  full      Palpation   SI assessment   pelvis in correct alignment                Pelvic Floor Special Questions - 01/11/19 0001    Marinoff Scale  discomfort that does not affect completion        [facility] Adult PT  Treatment/Exercise - 01/11/19 0001      Self-Care   Self-Care  Other Self-Care Comments    Other Self-Care Comments   discussed how to progress to the largest dilator since she is scared to , education on not rushing it, place the largest dilator around the perineum to get used to it, progress with placing in milimeters at a time and the process will be slow      Manual Therapy   Manual Therapy  Soft tissue mobilization;Myofascial release    Soft tissue mobilization  along the inner pubic rim with fascial release;     Myofascial Release  fascial release of the pelvic floor rocking in quadruped and diagonals              PT Education - 01/11/19 1645    Education Details  education on progression of the largest dilator with more working on the outside perineum first    Person(s) Educated  Patient    Methods  Explanation    Comprehension  Verbalized understanding       PT Short Term Goals - 08/25/18 0843      PT SHORT TERM GOAL #5   Title  understand how to use a vaginal dilator to expand the vaginal canal    Time  4    Period  Weeks    Status  Achieved        PT Long Term Goals - 01/11/19 1612      PT LONG TERM GOAL #1   Title  independent  with HEP and understand how to progress herself    Time  8    Period  Weeks    Status  Achieved      PT LONG TERM GOAL #2   Title  reduce pain to minimal to no pain with penile penetration so Marinoff scale is </= 0/3    Baseline  Marinoff 1/3    Time  8    Period  Weeks    Status  Achieved      PT LONG TERM GOAL #3   Title  reduce pain after penile penetration less than 1/10     Time  8    Period  Weeks    Status  Achieved      PT LONG TERM GOAL #4   Title  abilty to use the largest dilator to continue the expansion of the vaginal canal    Baseline  second to last dilator    Time  8    Period  Weeks    Status  Partially Met            Plan - 01/11/19 1614    Clinical Impression Statement  Patient is not using the largest dilator due to being scared of the size. Therapist educated patient on how to progress with rubbing the dilator around the outside of the perineum first and insert only by milimeters. Patient has no pain with intercourse when she is on bottom but 6/10 pain with her on top. Patient has full lumbar ROM and hip strength. Patient has returned to her running 3 times per week. Patient understand how to use the dilator to massage the pelvic floor muscles. Patient is ready for discharge.    Rehab Potential  Excellent    Clinical Impairments Affecting Rehab Potential  Vulvodynia    PT Frequency  --    PT Duration  --    PT Treatment/Interventions  Biofeedback;Electrical Stimulation;Moist Heat;Therapeutic exercise;Therapeutic activities;Neuromuscular re-education;Patient/family education;Manual  techniques;Dry needling    PT Next Visit Plan  discharge to HEP    PT Home Exercise Plan  Access Code: [email or web address]    Consulted and Agree with Plan of Care  Patient       Patient will benefit from skilled therapeutic intervention in order to improve the following deficits and impairments:  Increased fascial restricitons, Pain, Decreased coordination, Increased muscle spasms,  Decreased activity tolerance, Decreased endurance, Decreased strength  Visit Diagnosis: 1. Muscle weakness (generalized)   2. Cramp and spasm        Problem List Patient Active Problem List   Diagnosis Date Noted  . Chronic kidney disease 01/22/2012  . Proteinuria 01/22/2012  . Reflux nephropathy 01/22/2012    Earlie Counts, PT 01/11/19 4:50 PM   [facility] [address] [place], [place], [address] Phone: [phone]   Fax:  [phone]  Name: Danielle Maffeo MRN: [id number] Date of Birth: [birth year] PHYSICAL THERAPY DISCHARGE SUMMARY  Visits from Start of Care: 12  Current functional level related to goals / functional outcomes: See above.     Remaining deficits: See above.    Education / Equipment: HEP Plan: Patient agrees to discharge.  Patient goals were partially met. Patient is being discharged due to being pleased with the current functional level.  Thank you for the referral. Earlie Counts, PT 01/11/19 4:51 PM  ?????

## 2019-03-15 DIAGNOSIS — Z20828 Contact with and (suspected) exposure to other viral communicable diseases: Secondary | ICD-10-CM | POA: Diagnosis not present

## 2019-03-21 ENCOUNTER — Ambulatory Visit (INDEPENDENT_AMBULATORY_CARE_PROVIDER_SITE_OTHER): Payer: BC Managed Care – PPO | Admitting: Psychology

## 2019-03-21 DIAGNOSIS — F4321 Adjustment disorder with depressed mood: Secondary | ICD-10-CM | POA: Diagnosis not present

## 2019-03-25 DIAGNOSIS — U071 COVID-19: Secondary | ICD-10-CM | POA: Diagnosis not present

## 2019-03-25 DIAGNOSIS — Z20828 Contact with and (suspected) exposure to other viral communicable diseases: Secondary | ICD-10-CM | POA: Diagnosis not present

## 2019-04-06 ENCOUNTER — Ambulatory Visit (INDEPENDENT_AMBULATORY_CARE_PROVIDER_SITE_OTHER): Payer: BC Managed Care – PPO | Admitting: Psychology

## 2019-04-06 DIAGNOSIS — F4321 Adjustment disorder with depressed mood: Secondary | ICD-10-CM

## 2019-04-11 DIAGNOSIS — N184 Chronic kidney disease, stage 4 (severe): Secondary | ICD-10-CM | POA: Diagnosis not present

## 2019-04-11 DIAGNOSIS — R809 Proteinuria, unspecified: Secondary | ICD-10-CM | POA: Diagnosis not present

## 2019-04-11 DIAGNOSIS — I129 Hypertensive chronic kidney disease with stage 1 through stage 4 chronic kidney disease, or unspecified chronic kidney disease: Secondary | ICD-10-CM | POA: Diagnosis not present

## 2019-04-12 ENCOUNTER — Ambulatory Visit (INDEPENDENT_AMBULATORY_CARE_PROVIDER_SITE_OTHER): Payer: BC Managed Care – PPO | Admitting: Psychology

## 2019-04-12 DIAGNOSIS — F4321 Adjustment disorder with depressed mood: Secondary | ICD-10-CM | POA: Diagnosis not present

## 2019-04-14 DIAGNOSIS — M79641 Pain in right hand: Secondary | ICD-10-CM | POA: Diagnosis not present

## 2019-04-14 DIAGNOSIS — M25541 Pain in joints of right hand: Secondary | ICD-10-CM | POA: Diagnosis not present

## 2019-04-14 DIAGNOSIS — N289 Disorder of kidney and ureter, unspecified: Secondary | ICD-10-CM | POA: Diagnosis not present

## 2019-04-14 DIAGNOSIS — M25542 Pain in joints of left hand: Secondary | ICD-10-CM | POA: Diagnosis not present

## 2019-04-14 DIAGNOSIS — R809 Proteinuria, unspecified: Secondary | ICD-10-CM | POA: Diagnosis not present

## 2019-04-14 DIAGNOSIS — R600 Localized edema: Secondary | ICD-10-CM | POA: Diagnosis not present

## 2019-04-14 DIAGNOSIS — R599 Enlarged lymph nodes, unspecified: Secondary | ICD-10-CM | POA: Diagnosis not present

## 2019-04-14 DIAGNOSIS — M79642 Pain in left hand: Secondary | ICD-10-CM | POA: Diagnosis not present

## 2019-04-20 ENCOUNTER — Ambulatory Visit (INDEPENDENT_AMBULATORY_CARE_PROVIDER_SITE_OTHER): Payer: BC Managed Care – PPO | Admitting: Psychology

## 2019-04-20 DIAGNOSIS — F4321 Adjustment disorder with depressed mood: Secondary | ICD-10-CM | POA: Diagnosis not present

## 2019-04-24 ENCOUNTER — Ambulatory Visit (INDEPENDENT_AMBULATORY_CARE_PROVIDER_SITE_OTHER): Payer: BC Managed Care – PPO | Admitting: Psychology

## 2019-04-24 DIAGNOSIS — F4321 Adjustment disorder with depressed mood: Secondary | ICD-10-CM

## 2019-05-17 ENCOUNTER — Ambulatory Visit (INDEPENDENT_AMBULATORY_CARE_PROVIDER_SITE_OTHER): Payer: BC Managed Care – PPO | Admitting: Psychology

## 2019-05-17 DIAGNOSIS — F4321 Adjustment disorder with depressed mood: Secondary | ICD-10-CM

## 2019-05-25 ENCOUNTER — Ambulatory Visit (INDEPENDENT_AMBULATORY_CARE_PROVIDER_SITE_OTHER): Payer: BC Managed Care – PPO | Admitting: Psychology

## 2019-05-25 DIAGNOSIS — F4321 Adjustment disorder with depressed mood: Secondary | ICD-10-CM | POA: Diagnosis not present

## 2019-06-04 ENCOUNTER — Ambulatory Visit (INDEPENDENT_AMBULATORY_CARE_PROVIDER_SITE_OTHER): Payer: BC Managed Care – PPO | Admitting: Psychology

## 2019-06-04 DIAGNOSIS — F4321 Adjustment disorder with depressed mood: Secondary | ICD-10-CM

## 2019-06-12 ENCOUNTER — Ambulatory Visit (INDEPENDENT_AMBULATORY_CARE_PROVIDER_SITE_OTHER): Payer: BC Managed Care – PPO | Admitting: Psychology

## 2019-06-12 DIAGNOSIS — F4321 Adjustment disorder with depressed mood: Secondary | ICD-10-CM

## 2019-06-19 ENCOUNTER — Ambulatory Visit (INDEPENDENT_AMBULATORY_CARE_PROVIDER_SITE_OTHER): Payer: BC Managed Care – PPO | Admitting: Psychology

## 2019-06-19 DIAGNOSIS — F4321 Adjustment disorder with depressed mood: Secondary | ICD-10-CM

## 2019-07-03 ENCOUNTER — Ambulatory Visit (INDEPENDENT_AMBULATORY_CARE_PROVIDER_SITE_OTHER): Payer: BC Managed Care – PPO | Admitting: Psychology

## 2019-07-03 DIAGNOSIS — F4321 Adjustment disorder with depressed mood: Secondary | ICD-10-CM

## 2019-07-10 ENCOUNTER — Ambulatory Visit (INDEPENDENT_AMBULATORY_CARE_PROVIDER_SITE_OTHER): Payer: BC Managed Care – PPO | Admitting: Psychology

## 2019-07-10 DIAGNOSIS — F4321 Adjustment disorder with depressed mood: Secondary | ICD-10-CM | POA: Diagnosis not present

## 2019-07-24 ENCOUNTER — Ambulatory Visit (INDEPENDENT_AMBULATORY_CARE_PROVIDER_SITE_OTHER): Payer: BC Managed Care – PPO | Admitting: Psychology

## 2019-07-24 DIAGNOSIS — F4321 Adjustment disorder with depressed mood: Secondary | ICD-10-CM

## 2019-08-02 ENCOUNTER — Ambulatory Visit (INDEPENDENT_AMBULATORY_CARE_PROVIDER_SITE_OTHER): Payer: BC Managed Care – PPO | Admitting: Psychology

## 2019-08-02 DIAGNOSIS — F4321 Adjustment disorder with depressed mood: Secondary | ICD-10-CM

## 2019-08-17 ENCOUNTER — Ambulatory Visit (INDEPENDENT_AMBULATORY_CARE_PROVIDER_SITE_OTHER): Payer: BC Managed Care – PPO | Admitting: Psychology

## 2019-08-17 DIAGNOSIS — F4321 Adjustment disorder with depressed mood: Secondary | ICD-10-CM | POA: Diagnosis not present

## 2019-08-31 ENCOUNTER — Ambulatory Visit (INDEPENDENT_AMBULATORY_CARE_PROVIDER_SITE_OTHER): Payer: BC Managed Care – PPO | Admitting: Psychology

## 2019-08-31 DIAGNOSIS — F4321 Adjustment disorder with depressed mood: Secondary | ICD-10-CM

## 2019-09-14 ENCOUNTER — Ambulatory Visit (INDEPENDENT_AMBULATORY_CARE_PROVIDER_SITE_OTHER): Payer: BC Managed Care – PPO | Admitting: Psychology

## 2019-09-14 DIAGNOSIS — F4321 Adjustment disorder with depressed mood: Secondary | ICD-10-CM

## 2019-09-21 ENCOUNTER — Ambulatory Visit (INDEPENDENT_AMBULATORY_CARE_PROVIDER_SITE_OTHER): Payer: BC Managed Care – PPO | Admitting: Psychology

## 2019-09-21 DIAGNOSIS — F4321 Adjustment disorder with depressed mood: Secondary | ICD-10-CM

## 2019-09-28 ENCOUNTER — Ambulatory Visit (INDEPENDENT_AMBULATORY_CARE_PROVIDER_SITE_OTHER): Payer: BC Managed Care – PPO | Admitting: Psychology

## 2019-09-28 DIAGNOSIS — F4321 Adjustment disorder with depressed mood: Secondary | ICD-10-CM | POA: Diagnosis not present

## 2019-10-03 DIAGNOSIS — Z23 Encounter for immunization: Secondary | ICD-10-CM | POA: Diagnosis not present

## 2019-10-05 ENCOUNTER — Ambulatory Visit (INDEPENDENT_AMBULATORY_CARE_PROVIDER_SITE_OTHER): Payer: BC Managed Care – PPO | Admitting: Psychology

## 2019-10-05 DIAGNOSIS — F4321 Adjustment disorder with depressed mood: Secondary | ICD-10-CM

## 2019-10-11 ENCOUNTER — Ambulatory Visit (INDEPENDENT_AMBULATORY_CARE_PROVIDER_SITE_OTHER): Payer: BC Managed Care – PPO | Admitting: Psychology

## 2019-10-11 DIAGNOSIS — F4321 Adjustment disorder with depressed mood: Secondary | ICD-10-CM

## 2019-10-18 ENCOUNTER — Ambulatory Visit (INDEPENDENT_AMBULATORY_CARE_PROVIDER_SITE_OTHER): Payer: BC Managed Care – PPO | Admitting: Psychology

## 2019-10-18 DIAGNOSIS — F4321 Adjustment disorder with depressed mood: Secondary | ICD-10-CM | POA: Diagnosis not present

## 2019-10-25 ENCOUNTER — Ambulatory Visit (INDEPENDENT_AMBULATORY_CARE_PROVIDER_SITE_OTHER): Payer: BC Managed Care – PPO | Admitting: Psychology

## 2019-10-25 DIAGNOSIS — F4321 Adjustment disorder with depressed mood: Secondary | ICD-10-CM

## 2019-11-15 ENCOUNTER — Ambulatory Visit (INDEPENDENT_AMBULATORY_CARE_PROVIDER_SITE_OTHER): Payer: BC Managed Care – PPO | Admitting: Psychology

## 2019-11-15 DIAGNOSIS — F4321 Adjustment disorder with depressed mood: Secondary | ICD-10-CM

## 2019-12-06 ENCOUNTER — Ambulatory Visit: Payer: BC Managed Care – PPO | Admitting: Psychology

## 2019-12-06 DIAGNOSIS — Z23 Encounter for immunization: Secondary | ICD-10-CM | POA: Diagnosis not present

## 2019-12-20 ENCOUNTER — Ambulatory Visit (INDEPENDENT_AMBULATORY_CARE_PROVIDER_SITE_OTHER): Payer: BC Managed Care – PPO | Admitting: Psychology

## 2019-12-20 DIAGNOSIS — F4321 Adjustment disorder with depressed mood: Secondary | ICD-10-CM

## 2019-12-25 DIAGNOSIS — Z760 Encounter for issue of repeat prescription: Secondary | ICD-10-CM | POA: Diagnosis not present

## 2019-12-25 DIAGNOSIS — Z01411 Encounter for gynecological examination (general) (routine) with abnormal findings: Secondary | ICD-10-CM | POA: Diagnosis not present

## 2020-01-17 ENCOUNTER — Ambulatory Visit (INDEPENDENT_AMBULATORY_CARE_PROVIDER_SITE_OTHER): Payer: BC Managed Care – PPO | Admitting: Psychology

## 2020-01-17 DIAGNOSIS — F4321 Adjustment disorder with depressed mood: Secondary | ICD-10-CM | POA: Diagnosis not present

## 2020-02-21 ENCOUNTER — Ambulatory Visit: Payer: BC Managed Care – PPO | Admitting: Psychology

## 2020-02-25 ENCOUNTER — Ambulatory Visit (INDEPENDENT_AMBULATORY_CARE_PROVIDER_SITE_OTHER): Payer: BC Managed Care – PPO | Admitting: Psychology

## 2020-02-25 DIAGNOSIS — F4321 Adjustment disorder with depressed mood: Secondary | ICD-10-CM | POA: Diagnosis not present

## 2020-03-28 ENCOUNTER — Ambulatory Visit (INDEPENDENT_AMBULATORY_CARE_PROVIDER_SITE_OTHER): Payer: BC Managed Care – PPO | Admitting: Psychology

## 2020-03-28 DIAGNOSIS — F4321 Adjustment disorder with depressed mood: Secondary | ICD-10-CM

## 2020-04-04 DIAGNOSIS — Z23 Encounter for immunization: Secondary | ICD-10-CM | POA: Diagnosis not present

## 2020-04-24 ENCOUNTER — Ambulatory Visit (INDEPENDENT_AMBULATORY_CARE_PROVIDER_SITE_OTHER): Payer: Self-pay | Admitting: Psychology

## 2020-04-24 DIAGNOSIS — F4321 Adjustment disorder with depressed mood: Secondary | ICD-10-CM

## 2020-04-30 DIAGNOSIS — Z23 Encounter for immunization: Secondary | ICD-10-CM | POA: Diagnosis not present

## 2020-05-06 DIAGNOSIS — R809 Proteinuria, unspecified: Secondary | ICD-10-CM | POA: Diagnosis not present

## 2020-05-06 DIAGNOSIS — N1832 Chronic kidney disease, stage 3b: Secondary | ICD-10-CM | POA: Diagnosis not present

## 2020-05-06 DIAGNOSIS — I129 Hypertensive chronic kidney disease with stage 1 through stage 4 chronic kidney disease, or unspecified chronic kidney disease: Secondary | ICD-10-CM | POA: Diagnosis not present

## 2020-06-05 DIAGNOSIS — Z20822 Contact with and (suspected) exposure to covid-19: Secondary | ICD-10-CM | POA: Diagnosis not present

## 2020-06-05 DIAGNOSIS — J069 Acute upper respiratory infection, unspecified: Secondary | ICD-10-CM | POA: Diagnosis not present

## 2020-06-05 DIAGNOSIS — R519 Headache, unspecified: Secondary | ICD-10-CM | POA: Diagnosis not present

## 2020-06-10 ENCOUNTER — Encounter: Payer: Self-pay | Admitting: Emergency Medicine

## 2020-06-10 ENCOUNTER — Ambulatory Visit: Payer: BLUE CROSS/BLUE SHIELD | Admitting: Emergency Medicine

## 2020-06-10 ENCOUNTER — Other Ambulatory Visit: Payer: Self-pay

## 2020-06-10 VITALS — BP 111/72 | HR 54 | Temp 98.3°F | Resp 16 | Ht 64.0 in | Wt 123.0 lb

## 2020-06-10 DIAGNOSIS — J0140 Acute pansinusitis, unspecified: Secondary | ICD-10-CM | POA: Diagnosis not present

## 2020-06-10 DIAGNOSIS — R0981 Nasal congestion: Secondary | ICD-10-CM | POA: Diagnosis not present

## 2020-06-10 DIAGNOSIS — N189 Chronic kidney disease, unspecified: Secondary | ICD-10-CM

## 2020-06-10 DIAGNOSIS — R519 Headache, unspecified: Secondary | ICD-10-CM

## 2020-06-10 MED ORDER — PSEUDOEPHEDRINE-GUAIFENESIN ER 60-600 MG PO TB12: 1.0000 | ORAL_TABLET | Freq: Two times a day (BID) | ORAL | 1 refills | Status: AC

## 2020-06-10 MED ORDER — AMOXICILLIN-POT CLAVULANATE 875-125 MG PO TABS: 1.0000 | ORAL_TABLET | Freq: Two times a day (BID) | ORAL | 0 refills | Status: DC

## 2020-06-10 NOTE — Patient Instructions (Addendum)
   If you have lab work done today you will be contacted with your lab results within the next 2 weeks.  If you have not heard from us then please contact us. The fastest way to get your results is to register for My Chart.   IF you received an x-ray today, you will receive an invoice from [facility]. Please contact [facility] at [phone] with questions or concerns regarding your invoice.   IF you received labwork today, you will receive an invoice from [facility]. Please contact [facility] at [phone] with questions or concerns regarding your invoice.   Our billing staff will not be able to assist you with questions regarding bills from these companies.  You will be contacted with the lab results as soon as they are available. The fastest way to get your results is to activate your My Chart account. Instructions are located on the last page of this paperwork. If you have not heard from us regarding the results in 2 weeks, please contact this office.      Sinusitis, Adult Sinusitis is soreness and swelling (inflammation) of your sinuses. Sinuses are hollow spaces in the bones around your face. They are located:  Around your eyes.  In the middle of your forehead.  Behind your nose.  In your cheekbones. Your sinuses and nasal passages are lined with a fluid called mucus. Mucus drains out of your sinuses. Swelling can trap mucus in your sinuses. This lets germs (bacteria, virus, or fungus) grow, which leads to infection. Most of the time, this condition is caused by a virus. What are the causes? This condition is caused by:  Allergies.  Asthma.  Germs.  Things that block your nose or sinuses.  Growths in the nose (nasal polyps).  Chemicals or irritants in the air.  Fungus (rare). What increases the risk? You are more likely to develop this condition if:  You have a weak body defense system (immune system).  You do a lot of swimming or  diving.  You use nasal sprays too much.  You smoke. What are the signs or symptoms? The main symptoms of this condition are pain and a feeling of pressure around the sinuses. Other symptoms include:  Stuffy nose (congestion).  Runny nose (drainage).  Swelling and warmth in the sinuses.  Headache.  Toothache.  A cough that may get worse at night.  Mucus that collects in the throat or the back of the nose (postnasal drip).  Being unable to smell and taste.  Being very tired (fatigue).  A fever.  Sore throat.  Bad breath. How is this diagnosed? This condition is diagnosed based on:  Your symptoms.  Your medical history.  A physical exam.  Tests to find out if your condition is short-term (acute) or long-term (chronic). Your doctor may: ? Check your nose for growths (polyps). ? Check your sinuses using a tool that has a light (endoscope). ? Check for allergies or germs. ? Do imaging tests, such as an MRI or CT scan. How is this treated? Treatment for this condition depends on the cause and whether it is short-term or long-term.  If caused by a virus, your symptoms should go away on their own within 10 days. You may be given medicines to relieve symptoms. They include: ? Medicines that shrink swollen tissue in the nose. ? Medicines that treat allergies (antihistamines). ? A spray that treats swelling of the nostrils. ? Rinses that help get rid of thick mucus in   your nose (nasal saline washes).  If caused by bacteria, your doctor may wait to see if you will get better without treatment. You may be given antibiotic medicine if you have: ? A very bad infection. ? A weak body defense system.  If caused by growths in the nose, you may need to have surgery. Follow these instructions at home: Medicines  Take, use, or apply over-the-counter and prescription medicines only as told by your doctor. These may include nasal sprays.  If you were prescribed an antibiotic  medicine, take it as told by your doctor. Do not stop taking the antibiotic even if you start to feel better. Hydrate and humidify   Drink enough water to keep your pee (urine) pale yellow.  Use a cool mist humidifier to keep the humidity level in your home above 50%.  Breathe in steam for 10-15 minutes, 3-4 times a day, or as told by your doctor. You can do this in the bathroom while a hot shower is running.  Try not to spend time in cool or dry air. Rest  Rest as much as you can.  Sleep with your head raised (elevated).  Make sure you get enough sleep each night. General instructions   Put a warm, moist washcloth on your face 3-4 times a day, or as often as told by your doctor. This will help with discomfort.  Wash your hands often with soap and water. If there is no soap and water, use hand sanitizer.  Do not smoke. Avoid being around people who are smoking (secondhand smoke).  Keep all follow-up visits as told by your doctor. This is important. Contact a doctor if:  You have a fever.  Your symptoms get worse.  Your symptoms do not get better within 10 days. Get help right away if:  You have a very bad headache.  You cannot stop throwing up (vomiting).  You have very bad pain or swelling around your face or eyes.  You have trouble seeing.  You feel confused.  Your neck is stiff.  You have trouble breathing. Summary  Sinusitis is swelling of your sinuses. Sinuses are hollow spaces in the bones around your face.  This condition is caused by tissues in your nose that become inflamed or swollen. This traps germs. These can lead to infection.  If you were prescribed an antibiotic medicine, take it as told by your doctor. Do not stop taking it even if you start to feel better.  Keep all follow-up visits as told by your doctor. This is important. This information is not intended to replace advice given to you by your health care provider. Make sure you discuss  any questions you have with your health care provider. Document Revised: 12/05/2017 Document Reviewed: 12/05/2017 [employer]vier Patient Education  2020 [employer]  

## 2020-06-10 NOTE — Progress Notes (Signed)
Danielle Ponce 24 y.o.   Chief Complaint  Patient presents with  . Headache    per patient Danielle Ponce went to Urgent Care in [place] last Thursday and sinus problem started last Tuesday or Wednesy  . Nasal Congestion    per patient had this since the cold about 2-2 1/2 weeks ago    HISTORY OF PRESENT ILLNESS: This is a 24 y.o. female complaining of sinus headache and pressure that started after getting a cold 3 weeks ago. Complaining of sinus congestion with pressure.  No other significant symptoms. 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  Dx at age 89-6.  Has every 6 month follow up. Medications: enalapril 10 mg pd, Desipramine Last checked around 1 month ago.  No other complaints or medical concerns today.  HPI   Prior to Admission medications   Medication Sig Start Date End Date Taking? Authorizing Provider  desipramine (NOPRAMIN) 10 MG tablet Take 10 mg by mouth at bedtime.    [provider]  desipramine (NORPRAMIN) 25 MG tablet Take 25 mg by mouth daily.    [provider]  enalapril (VASOTEC) 10 MG tablet Take by mouth.    [provider]  nitrofurantoin, macrocrystal-monohydrate, (MACROBID) 100 MG capsule Take 1 capsule (100 mg total) by mouth 2 (two) times daily. 08/07/18   Shade FloodGreene, Jeffrey R, MD  norethindrone-ethinyl estradiol (MICROGESTIN) 1-20 MG-MCG tablet Take 1 tablet by mouth daily.    [provider]    Not on File  Patient Active Problem List   Diagnosis Date Noted  . Chronic kidney disease 01/22/2012  . Proteinuria 01/22/2012  . Reflux nephropathy 01/22/2012    Past Medical History:  Diagnosis Date  . Kidney disease 2001    Past Surgical History:  Procedure Laterality Date  . birth mark removal  2000  . TONSILLECTOMY  2004  . WISDOM TOOTH EXTRACTION  2011    Social History   Socioeconomic History  . Marital status: Single    Spouse name: Not on file  . Number of children: Not on file  . Years of  education: Not on file  . Highest education level: Not on file  Occupational History  . Not on file  Tobacco Use  . Smoking status: Never Smoker  . Smokeless tobacco: Never Used  Substance and Sexual Activity  . Alcohol use: No    Alcohol/week: 0.0 standard drinks  . Drug use: No  . Sexual activity: Yes    Partners: Male  Other Topics Concern  . Not on file  Social History Narrative  . Not on file   Social Determinants of Health   Financial Resource Strain:   . Difficulty of Paying Living Expenses: Not on file  Food Insecurity:   . Worried About Programme researcher, broadcasting/film/videounning Out of Food in the Last Year: Not on file  . Ran Out of Food in the Last Year: Not on file  Transportation Needs:   . Lack of Transportation (Medical): Not on file  . Lack of Transportation (Non-Medical): Not on file  Physical Activity:   . Days of Exercise per Week: Not on file  . Minutes of Exercise per Session: Not on file  Stress:   . Feeling of Stress : Not on file  Social Connections:   . Frequency of Communication with Friends and Family: Not on file  . Frequency of Social Gatherings with Friends and Family: Not on file  . Attends Religious Services: Not on file  . Active Member of  Clubs or Organizations: Not on file  . Attends Banker Meetings: Not on file  . Marital Status: Not on file  Intimate Partner Violence:   . Fear of Current or Ex-Partner: Not on file  . Emotionally Abused: Not on file  . Physically Abused: Not on file  . Sexually Abused: Not on file    Family History  Problem Relation Age of Onset  . Hypertension Father   . Diabetes Father      Review of Systems  Constitutional: Negative.  Negative for chills and fever.  HENT: Positive for congestion and sinus pain. Negative for sore throat.   Respiratory: Negative.  Negative for shortness of breath.   Cardiovascular: Negative.  Negative for chest pain and palpitations.  Gastrointestinal: Negative.  Negative for abdominal pain,  blood in stool, diarrhea, melena, nausea and vomiting.  Genitourinary: Negative.  Negative for dysuria and hematuria.  Musculoskeletal: Negative.  Negative for myalgias and neck pain.  Skin: Negative.  Negative for rash.  Neurological: Negative for dizziness and headaches.  All other systems reviewed and are negative.   Vitals:   06/10/20 1529  BP: 111/72  Pulse: (!) 54  Resp: 16  Temp: 98.3 F (36.8 C)  SpO2: 99%    Physical Exam Vitals reviewed.  Constitutional:      Appearance: Normal appearance.  HENT:     Head: Normocephalic.     Right Ear: Tympanic membrane, ear canal and external ear normal.     Left Ear: Tympanic membrane, ear canal and external ear normal.     Nose: Nose normal. No congestion.     Mouth/Throat:     Mouth: Mucous membranes are moist.     Pharynx: Oropharynx is clear.  Eyes:     Extraocular Movements: Extraocular movements intact.     Conjunctiva/sclera: Conjunctivae normal.     Pupils: Pupils are equal, round, and reactive to light.  Cardiovascular:     Rate and Rhythm: Normal rate and regular rhythm.     Pulses: Normal pulses.     Heart sounds: Normal heart sounds.  Pulmonary:     Effort: Pulmonary effort is normal.     Breath sounds: Normal breath sounds.  Musculoskeletal:        General: Normal range of motion.     Cervical back: Normal range of motion and neck supple. No tenderness.  Lymphadenopathy:     Cervical: No cervical adenopathy.  Skin:    General: Skin is warm and dry.     Capillary Refill: Capillary refill takes less than 2 seconds.  Neurological:     General: No focal deficit present.     Mental Status: Danielle Ponce is alert and oriented to person, place, and time.  Psychiatric:        Mood and Affect: Mood normal.        Behavior: Behavior normal.      ASSESSMENT & PLAN: Danielle Ponce was seen today for headache and nasal congestion.  Diagnoses and all orders for this visit:  Sinus headache  Sinus congestion -      pseudoephedrine-guaifenesin (MUCINEX D) 60-600 MG 12 hr tablet; Take 1 tablet by mouth every 12 (twelve) hours for 5 days.  Acute non-recurrent pansinusitis -     amoxicillin-clavulanate (AUGMENTIN) 875-125 MG tablet; Take 1 tablet by mouth 2 (two) times daily for 7 days.  Chronic kidney disease, unspecified CKD stage    Patient Instructions       If you have lab work done today  you will be contacted with your lab results within the next 2 weeks.  If you have not heard from [place] then please contact us. The fastest way to get your results is to register for My Chart.   IF you received an x-ray today, you will receive an invoice from [facility]. Please contact [facility] at [phone] with questions or concerns regarding your invoice.   IF you received labwork today, you will receive an invoice from [facility]. Please contact [facility] at [phone] with questions or concerns regarding your invoice.   Our billing staff will not be able to assist you with questions regarding bills from these companies.  You will be contacted with the lab results as soon as they are available. The fastest way to get your results is to activate your My Chart account. Instructions are located on the last page of this paperwork. If you have not heard from [place] regarding the results in 2 weeks, please contact this office.     Sinusitis, Adult Sinusitis is soreness and swelling (inflammation) of your sinuses. Sinuses are hollow spaces in the bones around your face. They are located:  Around your eyes.  In the middle of your forehead.  Behind your nose.  In your cheekbones. Your sinuses and nasal passages are lined with a fluid called mucus. Mucus drains out of your sinuses. Swelling can trap mucus in your sinuses. This lets germs (bacteria, virus, or fungus) grow, which leads to infection. Most of the time, this condition is caused by a virus. What are the causes? This condition is  caused by:  Allergies.  Asthma.  Germs.  Things that block your nose or sinuses.  Growths in the nose (nasal polyps).  Chemicals or irritants in the air.  Fungus (rare). What increases the risk? You are more likely to develop this condition if:  You have a weak body defense system (immune system).  You do a lot of swimming or diving.  You use nasal sprays too much.  You smoke. What are the signs or symptoms? The main symptoms of this condition are pain and a feeling of pressure around the sinuses. Other symptoms include:  Stuffy nose (congestion).  Runny nose (drainage).  Swelling and warmth in the sinuses.  Headache.  Toothache.  A cough that may get worse at night.  Mucus that collects in the throat or the back of the nose (postnasal drip).  Being unable to smell and taste.  Being very tired (fatigue).  A fever.  Sore throat.  Bad breath. How is this diagnosed? This condition is diagnosed based on:  Your symptoms.  Your medical history.  A physical exam.  Tests to find out if your condition is short-term (acute) or long-term (chronic). Your doctor may: ? Check your nose for growths (polyps). ? Check your sinuses using a tool that has a light (endoscope). ? Check for allergies or germs. ? Do imaging tests, such as an MRI or CT scan. How is this treated? Treatment for this condition depends on the cause and whether it is short-term or long-term.  If caused by a virus, your symptoms should go away on their own within 10 days. You may be given medicines to relieve symptoms. They include: ? Medicines that shrink swollen tissue in the nose. ? Medicines that treat allergies (antihistamines). ? A spray that treats swelling of the nostrils. ? Rinses that help get rid of thick mucus in your nose (nasal saline washes).  If caused by bacteria, your  doctor may wait to see if you will get better without treatment. You may be given antibiotic medicine if  you have: ? A very bad infection. ? A weak body defense system.  If caused by growths in the nose, you may need to have surgery. Follow these instructions at home: Medicines  Take, use, or apply over-the-counter and prescription medicines only as told by your doctor. These may include nasal sprays.  If you were prescribed an antibiotic medicine, take it as told by your doctor. Do not stop taking the antibiotic even if you start to feel better. Hydrate and humidify   Drink enough water to keep your pee (urine) pale yellow.  Use a cool mist humidifier to keep the humidity level in your home above 50%.  Breathe in steam for 10-15 minutes, 3-4 times a day, or as told by your doctor. You can do this in the bathroom while a hot shower is running.  Try not to spend time in cool or dry air. Rest  Rest as much as you can.  Sleep with your head raised (elevated).  Make sure you get enough sleep each night. General instructions   Put a warm, moist washcloth on your face 3-4 times a day, or as often as told by your doctor. This will help with discomfort.  Wash your hands often with soap and water. If there is no soap and water, use hand sanitizer.  Do not smoke. Avoid being around people who are smoking (secondhand smoke).  Keep all follow-up visits as told by your doctor. This is important. Contact a doctor if:  You have a fever.  Your symptoms get worse.  Your symptoms do not get better within 10 days. Get help right away if:  You have a very bad headache.  You cannot stop throwing up (vomiting).  You have very bad pain or swelling around your face or eyes.  You have trouble seeing.  You feel confused.  Your neck is stiff.  You have trouble breathing. Summary  Sinusitis is swelling of your sinuses. Sinuses are hollow spaces in the bones around your face.  This condition is caused by tissues in your nose that become inflamed or swollen. This traps germs. These can  lead to infection.  If you were prescribed an antibiotic medicine, take it as told by your doctor. Do not stop taking it even if you start to feel better.  Keep all follow-up visits as told by your doctor. This is important. This information is not intended to replace advice given to you by your health care provider. Make sure you discuss any questions you have with your health care provider. Document Revised: 12/05/2017 Document Reviewed: 12/05/2017 Elsevier Patient Education  2020 [employer].      Edwina Barth, MD [facility]

## 2020-06-16 ENCOUNTER — Ambulatory Visit: Payer: Self-pay | Admitting: *Deleted

## 2020-06-16 NOTE — Telephone Encounter (Signed)
  Pt called in still c/o having tingling in the back of her head.   She was seen last Tuesday for a sinus infection and given antibiotic.   Her infection has gotten better but the tingling in the back of her head has not improved.  A message was sent to [facility] [place] to call her back for an appt.  when they return from lunch. ([facility] does not schedule for [place]).  Pt was agreeable to this plan.   Reason for Disposition . [1] MODERATE headache (e.g., interferes with normal activities) AND [2] present > 24 hours AND [3] unexplained  (Exceptions: analgesics not tried, typical migraine, or headache part of viral illness)  Answer Assessment - Initial Assessment Questions 1. LOCATION: "Where does it hurt?"      I was in the office  Last Tuesday.   I dx with a sinus infection.   I'm having tingling in the back of my head still and the sinus headache too. 2. ONSET: "When did the headache start?" (Minutes, hours or days)      The tingling comes and goes.  Started 1 1/2 wk. Ago. 3. PATTERN: "Does the pain come and go, or has it been constant since it started?"     Comes and goes 4. SEVERITY: "How bad is the pain?" and "What does it keep you from doing?"  (e.g., Scale 1-10; mild, moderate, or severe)   - MILD (1-3): doesn't interfere with normal activities    - MODERATE (4-7): interferes with normal activities or awakens from sleep    - SEVERE (8-10): excruciating pain, unable to do any normal activities        Tingling  5. RECURRENT SYMPTOM: "Have you ever had headaches before?" If Yes, ask: "When was the last time?" and "What happened that time?"      No  This is the first sinus infection I've ever had.   6. CAUSE: "What do you think is causing the headache?"     Maybe the sinus infection. 7. MIGRAINE: "Have you been diagnosed with migraine headaches?" If Yes, ask: "Is this headache similar?"      No 8. HEAD INJURY: "Has there been any recent injury to the head?"      No 9. OTHER  SYMPTOMS: "Do you have any other symptoms?" (fever, stiff neck, eye pain, sore throat, cold symptoms)     Getting better 10. PREGNANCY: "Is there any chance you are pregnant?" "When was your last menstrual period?"       No  Protocols used: HEADACHE-A-AH

## 2020-06-17 ENCOUNTER — Ambulatory Visit (INDEPENDENT_AMBULATORY_CARE_PROVIDER_SITE_OTHER): Payer: BC Managed Care – PPO | Admitting: Emergency Medicine

## 2020-06-17 ENCOUNTER — Encounter: Payer: Self-pay | Admitting: Emergency Medicine

## 2020-06-17 ENCOUNTER — Other Ambulatory Visit: Payer: Self-pay

## 2020-06-17 VITALS — BP 126/81 | HR 65 | Temp 98.7°F | Ht 64.0 in | Wt 122.2 lb

## 2020-06-17 DIAGNOSIS — R202 Paresthesia of skin: Secondary | ICD-10-CM

## 2020-06-17 DIAGNOSIS — N189 Chronic kidney disease, unspecified: Secondary | ICD-10-CM

## 2020-06-17 DIAGNOSIS — F418 Other specified anxiety disorders: Secondary | ICD-10-CM | POA: Diagnosis not present

## 2020-06-17 MED ORDER — ALPRAZOLAM 0.5 MG PO TABS: 0.5000 mg | ORAL_TABLET | Freq: Two times a day (BID) | ORAL | 1 refills | Status: DC | PRN

## 2020-06-17 NOTE — Progress Notes (Signed)
Danielle Ponce 24 y.o.   Chief Complaint  Patient presents with  . Tingling    pt is having tingling in the head, tingling moves around. not sure if this has to do with the meds just completed for sinus infection or the anxiety she is facing with finding out father has stage 4 colon cancer and surgery being tomorrow    HISTORY OF PRESENT ILLNESS: This is a 24 y.o. female complaining of tingling on her head and face for the past couple weeks. Seen by me last week with symptoms of acute sinusitis.  Treated with antibiotics and decongestants.  Did well but tingling remains. Increased anxiety at home due to father's illness dealing with metastatic colon cancer. No other complaints or medical concerns today.  HPI   Prior to Admission medications   Medication Sig Start Date End Date Taking? Authorizing Provider  desipramine (NOPRAMIN) 10 MG tablet Take 10 mg by mouth at bedtime.    Yes [provider]  desipramine (NORPRAMIN) 25 MG tablet Take 25 mg by mouth daily.    Yes [provider]  enalapril (VASOTEC) 10 MG tablet Take by mouth.   Yes [provider]  nitrofurantoin, macrocrystal-monohydrate, (MACROBID) 100 MG capsule Take 1 capsule (100 mg total) by mouth 2 (two) times daily. 08/07/18  Yes Shade FloodGreene, Jeffrey R, MD  norethindrone-ethinyl estradiol (MICROGESTIN) 1-20 MG-MCG tablet Take 1 tablet by mouth daily.   Yes [provider]    Not on File  Patient Active Problem List   Diagnosis Date Noted  . Chronic kidney disease 01/22/2012  . Proteinuria 01/22/2012  . Reflux nephropathy 01/22/2012    Past Medical History:  Diagnosis Date  . Kidney disease 2001    Past Surgical History:  Procedure Laterality Date  . birth mark removal  2000  . TONSILLECTOMY  2004  . WISDOM TOOTH EXTRACTION  2011    Social History   Socioeconomic History  . Marital status: Single    Spouse name: Not on file  . Number of children: Not on file  . Years of  education: Not on file  . Highest education level: Not on file  Occupational History  . Not on file  Tobacco Use  . Smoking status: Never Smoker  . Smokeless tobacco: Never Used  Substance and Sexual Activity  . Alcohol use: No    Alcohol/week: 0.0 standard drinks  . Drug use: No  . Sexual activity: Yes    Partners: Male  Other Topics Concern  . Not on file  Social History Narrative  . Not on file   Social Determinants of Health   Financial Resource Strain:   . Difficulty of Paying Living Expenses: Not on file  Food Insecurity:   . Worried About Programme researcher, broadcasting/film/videounning Out of Food in the Last Year: Not on file  . Ran Out of Food in the Last Year: Not on file  Transportation Needs:   . Lack of Transportation (Medical): Not on file  . Lack of Transportation (Non-Medical): Not on file  Physical Activity:   . Days of Exercise per Week: Not on file  . Minutes of Exercise per Session: Not on file  Stress:   . Feeling of Stress : Not on file  Social Connections:   . Frequency of Communication with Friends and Family: Not on file  . Frequency of Social Gatherings with Friends and Family: Not on file  . Attends Religious Services: Not on file  . Active Member of Clubs or Organizations: Not  on file  . Attends Banker Meetings: Not on file  . Marital Status: Not on file  Intimate Partner Violence:   . Fear of Current or Ex-Partner: Not on file  . Emotionally Abused: Not on file  . Physically Abused: Not on file  . Sexually Abused: Not on file    Family History  Problem Relation Age of Onset  . Hypertension Father   . Diabetes Father      Review of Systems  Constitutional: Negative.  Negative for chills and fever.  HENT: Negative.  Negative for congestion and sore throat.   Eyes: Negative.   Respiratory: Negative.  Negative for cough and shortness of breath.   Cardiovascular: Negative.  Negative for chest pain and palpitations.  Gastrointestinal: Negative for abdominal  pain, nausea and vomiting.  Genitourinary: Negative.   Musculoskeletal: Negative.  Negative for myalgias and neck pain.  Skin: Negative.  Negative for rash.  Neurological: Positive for tingling. Negative for dizziness, sensory change, speech change, focal weakness, seizures, loss of consciousness and headaches.  All other systems reviewed and are negative.  Vitals:   06/17/20 1501  BP: 126/81  Pulse: 65  Temp: 98.7 F (37.1 C)  SpO2: 97%     Physical Exam Vitals reviewed.  Constitutional:      Appearance: Normal appearance.  HENT:     Head: Normocephalic.  Eyes:     Extraocular Movements: Extraocular movements intact.     Pupils: Pupils are equal, round, and reactive to light.  Cardiovascular:     Rate and Rhythm: Normal rate.  Pulmonary:     Effort: Pulmonary effort is normal.  Musculoskeletal:        General: Normal range of motion.     Cervical back: Normal range of motion.  Skin:    General: Skin is warm and dry.  Neurological:     General: No focal deficit present.     Mental Status: She is alert and oriented to person, place, and time.  Psychiatric:        Mood and Affect: Mood normal.        Behavior: Behavior normal.      ASSESSMENT & PLAN: [name] was seen today for tingling.  Diagnoses and all orders for this visit:  Situational anxiety -     Discontinue: ALPRAZolam (XANAX) 0.5 MG tablet; Take 1 tablet (0.5 mg total) by mouth 2 (two) times daily as needed for anxiety. -     ALPRAZolam (XANAX) 0.5 MG tablet; Take 1 tablet (0.5 mg total) by mouth 2 (two) times daily as needed for anxiety.  Tingling sensation in face Comments: And head  Paresthesia  Chronic kidney disease, unspecified CKD stage    Patient Instructions       If you have lab work done today you will be contacted with your lab results within the next 2 weeks.  If you have not heard from [place] then please contact us. The fastest way to get your results is to register for My  Chart.   IF you received an x-ray today, you will receive an invoice from [facility]. Please contact [facility] at [phone] with questions or concerns regarding your invoice.   IF you received labwork today, you will receive an invoice from [facility]. Please contact [facility] at [phone] with questions or concerns regarding your invoice.   Our billing staff will not be able to assist you with questions regarding bills from these companies.  You will be contacted with the  lab results as soon as they are available. The fastest way to get your results is to activate your My Chart account. Instructions are located on the last page of this paperwork. If you have not heard from [place] regarding the results in 2 weeks, please contact this office.     Managing Anxiety, Adult After being diagnosed with an anxiety disorder, you may be relieved to know why you have felt or behaved a certain way. You may also feel overwhelmed about the treatment ahead and what it will mean for your life. With care and support, you can manage this condition and recover from it. How to manage lifestyle changes Managing stress and anxiety  Stress is your body's reaction to life changes and events, both good and bad. Most stress will last just a few hours, but stress can be ongoing and can lead to more than just stress. Although stress can play a major role in anxiety, it is not the same as anxiety. Stress is usually caused by something external, such as a deadline, test, or competition. Stress normally passes after the triggering event has ended.  Anxiety is caused by something internal, such as imagining a terrible outcome or worrying that something will go wrong that will devastate you. Anxiety often does not go away even after the triggering event is over, and it can become long-term (chronic) worry. It is important to understand the differences between stress and anxiety and to manage your stress  effectively so that it does not lead to an anxious response. Talk with your health care provider or a counselor to learn more about reducing anxiety and stress. He or she may suggest tension reduction techniques, such as:  Music therapy. This can include creating or listening to music that you enjoy and that inspires you.  Mindfulness-based meditation. This involves being aware of your normal breaths while not trying to control your breathing. It can be done while sitting or walking.  Centering prayer. This involves focusing on a word, phrase, or sacred image that means something to you and brings you peace.  Deep breathing. To do this, expand your stomach and inhale slowly through your nose. Hold your breath for 3-5 seconds. Then exhale slowly, letting your stomach muscles relax.  Self-talk. This involves identifying thought patterns that lead to anxiety reactions and changing those patterns.  Muscle relaxation. This involves tensing muscles and then relaxing them. Choose a tension reduction technique that suits your lifestyle and personality. These techniques take time and practice. Set aside 5-15 minutes a day to do them. Therapists can offer counseling and training in these techniques. The training to help with anxiety may be covered by some insurance plans. Other things you can do to manage stress and anxiety include:  Keeping a stress/anxiety diary. This can help you learn what triggers your reaction and then learn ways to manage your response.  Thinking about how you react to certain situations. You may not be able to control everything, but you can control your response.  Making time for activities that help you relax and not feeling guilty about spending your time in this way.  Visual imagery and yoga can help you stay calm and relax.  Medicines Medicines can help ease symptoms. Medicines for anxiety include:  Anti-anxiety drugs.  Antidepressants. Medicines are often used as a  primary treatment for anxiety disorder. Medicines will be prescribed by a health care provider. When used together, medicines, psychotherapy, and tension reduction techniques may be the most effective  treatment. Relationships Relationships can play a big part in helping you recover. Try to spend more time connecting with trusted friends and family members. Consider going to couples counseling, taking family education classes, or going to family therapy. Therapy can help you and others better understand your condition. How to recognize changes in your anxiety Everyone responds differently to treatment for anxiety. Recovery from anxiety happens when symptoms decrease and stop interfering with your daily activities at home or work. This may mean that you will start to:  Have better concentration and focus. Worry will interfere less in your daily thinking.  Sleep better.  Be less irritable.  Have more energy.  Have improved memory. It is important to recognize when your condition is getting worse. Contact your health care provider if your symptoms interfere with home or work and you feel like your condition is not improving. Follow these instructions at home: Activity  Exercise. Most adults should do the following: ? Exercise for at least 150 minutes each week. The exercise should increase your heart rate and make you sweat (moderate-intensity exercise). ? Strengthening exercises at least twice a week.  Get the right amount and quality of sleep. Most adults need 7-9 hours of sleep each night. Lifestyle   Eat a healthy diet that includes plenty of vegetables, fruits, whole grains, low-fat dairy products, and lean protein. Do not eat a lot of foods that are high in solid fats, added sugars, or salt.  Make choices that simplify your life.  Do not use any products that contain nicotine or tobacco, such as cigarettes, e-cigarettes, and chewing tobacco. If you need help quitting, ask your health  care provider.  Avoid caffeine, alcohol, and certain over-the-counter cold medicines. These may make you feel worse. Ask your pharmacist which medicines to avoid. General instructions  Take over-the-counter and prescription medicines only as told by your health care provider.  Keep all follow-up visits as told by your health care provider. This is important. Where to find support You can get help and support from these sources:  Self-help groups.  Online and [employer].  A trusted spiritual leader.  Couples counseling.  Family education classes.  Family therapy. Where to find more information You may find that joining a support group helps you deal with your anxiety. The following sources can help you locate counselors or support groups near you:  Mental Health America: www.mentalhealthamerica.net  Anxiety and Depression Association of [place] (ADAA): ProgramCam.de  [employer] on Mental Illness (NAMI): www.nami.org Contact a health care provider if you:  Have a hard time staying focused or finishing daily tasks.  Spend many hours a day feeling worried about everyday life.  Become exhausted by worry.  Start to have headaches, feel tense, or have nausea.  Urinate more than normal.  Have diarrhea. Get help right away if you have:  A racing heart and shortness of breath.  Thoughts of hurting yourself or others. If you ever feel like you may hurt yourself or others, or have thoughts about taking your own life, get help right away. You can go to your nearest emergency department or call:  Your local emergency services (911 in the [place]  A suicide crisis helpline, such as the National Suicide Prevention Lifeline at 407-419-2548. This is open 24 hours a day. Summary  Taking steps to learn and use tension reduction techniques can help calm you and help prevent triggering an anxiety reaction.  When used together, medicines, psychotherapy, and  tension reduction techniques may  be the most effective treatment.  Family, friends, and partners can play a big part in helping you recover from an anxiety disorder. This information is not intended to replace advice given to you by your health care provider. Make sure you discuss any questions you have with your health care provider. Document Revised: 12/05/2018 Document Reviewed: 12/05/2018 Elsevier Patient Education  2020 Elsevier Inc.      Edwina Barth, MD [facility]

## 2020-06-17 NOTE — Patient Instructions (Addendum)
   If you have lab work done today you will be contacted with your lab results within the next 2 weeks.  If you have not heard from us then please contact us. The fastest way to get your results is to register for My Chart.   IF you received an x-ray today, you will receive an invoice from [facility]. Please contact [facility] at [phone] with questions or concerns regarding your invoice.   IF you received labwork today, you will receive an invoice from [facility]. Please contact [facility] at [phone] with questions or concerns regarding your invoice.   Our billing staff will not be able to assist you with questions regarding bills from these companies.  You will be contacted with the lab results as soon as they are available. The fastest way to get your results is to activate your My Chart account. Instructions are located on the last page of this paperwork. If you have not heard from us regarding the results in 2 weeks, please contact this office.      Managing Anxiety, Adult After being diagnosed with an anxiety disorder, you may be relieved to know why you have felt or behaved a certain way. You may also feel overwhelmed about the treatment ahead and what it will mean for your life. With care and support, you can manage this condition and recover from it. How to manage lifestyle changes Managing stress and anxiety  Stress is your body's reaction to life changes and events, both good and bad. Most stress will last just a few hours, but stress can be ongoing and can lead to more than just stress. Although stress can play a major role in anxiety, it is not the same as anxiety. Stress is usually caused by something external, such as a deadline, test, or competition. Stress normally passes after the triggering event has ended.  Anxiety is caused by something internal, such as imagining a terrible outcome or worrying that something will go wrong that will devastate  you. Anxiety often does not go away even after the triggering event is over, and it can become long-term (chronic) worry. It is important to understand the differences between stress and anxiety and to manage your stress effectively so that it does not lead to an anxious response. Talk with your health care provider or a counselor to learn more about reducing anxiety and stress. He or she may suggest tension reduction techniques, such as:  Music therapy. This can include creating or listening to music that you enjoy and that inspires you.  Mindfulness-based meditation. This involves being aware of your normal breaths while not trying to control your breathing. It can be done while sitting or walking.  Centering prayer. This involves focusing on a word, phrase, or sacred image that means something to you and brings you peace.  Deep breathing. To do this, expand your stomach and inhale slowly through your nose. Hold your breath for 3-5 seconds. Then exhale slowly, letting your stomach muscles relax.  Self-talk. This involves identifying thought patterns that lead to anxiety reactions and changing those patterns.  Muscle relaxation. This involves tensing muscles and then relaxing them. Choose a tension reduction technique that suits your lifestyle and personality. These techniques take time and practice. Set aside 5-15 minutes a day to do them. Therapists can offer counseling and training in these techniques. The training to help with anxiety may be covered by some insurance plans. Other things you can do to manage stress and   anxiety include:  Keeping a stress/anxiety diary. This can help you learn what triggers your reaction and then learn ways to manage your response.  Thinking about how you react to certain situations. You may not be able to control everything, but you can control your response.  Making time for activities that help you relax and not feeling guilty about spending your time in  this way.  Visual imagery and yoga can help you stay calm and relax.  Medicines Medicines can help ease symptoms. Medicines for anxiety include:  Anti-anxiety drugs.  Antidepressants. Medicines are often used as a primary treatment for anxiety disorder. Medicines will be prescribed by a health care provider. When used together, medicines, psychotherapy, and tension reduction techniques may be the most effective treatment. Relationships Relationships can play a big part in helping you recover. Try to spend more time connecting with trusted friends and family members. Consider going to couples counseling, taking family education classes, or going to family therapy. Therapy can help you and others better understand your condition. How to recognize changes in your anxiety Everyone responds differently to treatment for anxiety. Recovery from anxiety happens when symptoms decrease and stop interfering with your daily activities at home or work. This may mean that you will start to:  Have better concentration and focus. Worry will interfere less in your daily thinking.  Sleep better.  Be less irritable.  Have more energy.  Have improved memory. It is important to recognize when your condition is getting worse. Contact your health care provider if your symptoms interfere with home or work and you feel like your condition is not improving. Follow these instructions at home: Activity  Exercise. Most adults should do the following: ? Exercise for at least 150 minutes each week. The exercise should increase your heart rate and make you sweat (moderate-intensity exercise). ? Strengthening exercises at least twice a week.  Get the right amount and quality of sleep. Most adults need 7-9 hours of sleep each night. Lifestyle   Eat a healthy diet that includes plenty of vegetables, fruits, whole grains, low-fat dairy products, and lean protein. Do not eat a lot of foods that are high in solid  fats, added sugars, or salt.  Make choices that simplify your life.  Do not use any products that contain nicotine or tobacco, such as cigarettes, e-cigarettes, and chewing tobacco. If you need help quitting, ask your health care provider.  Avoid caffeine, alcohol, and certain over-the-counter cold medicines. These may make you feel worse. Ask your pharmacist which medicines to avoid. General instructions  Take over-the-counter and prescription medicines only as told by your health care provider.  Keep all follow-up visits as told by your health care provider. This is important. Where to find support You can get help and support from these sources:  Self-help groups.  Online and community organizations.  A trusted spiritual leader.  Couples counseling.  Family education classes.  Family therapy. Where to find more information You may find that joining a support group helps you deal with your anxiety. The following sources can help you locate counselors or support groups near you:  Mental Health America: www.mentalhealthamerica.net  Anxiety and Depression Association of America (ADAA): www.adaa.org  National Alliance on Mental Illness (NAMI): www.nami.org Contact a health care provider if you:  Have a hard time staying focused or finishing daily tasks.  Spend many hours a day feeling worried about everyday life.  Become exhausted by worry.  Start to have headaches, feel tense,   or have nausea.  Urinate more than normal.  Have diarrhea. Get help right away if you have:  A racing heart and shortness of breath.  Thoughts of hurting yourself or others. If you ever feel like you may hurt yourself or others, or have thoughts about taking your own life, get help right away. You can go to your nearest emergency department or call:  Your local emergency services (911 in the [place]  A suicide crisis helpline, such as the National Suicide Prevention Lifeline at  1-800-273-8255. This is open 24 hours a day. Summary  Taking steps to learn and use tension reduction techniques can help calm you and help prevent triggering an anxiety reaction.  When used together, medicines, psychotherapy, and tension reduction techniques may be the most effective treatment.  Family, friends, and partners can play a big part in helping you recover from an anxiety disorder. This information is not intended to replace advice given to you by your health care provider. Make sure you discuss any questions you have with your health care provider. Document Revised: 12/05/2018 Document Reviewed: 12/05/2018 Elsevier Patient Education  2020 Elsevier Inc.  

## 2020-06-18 ENCOUNTER — Telehealth: Payer: Self-pay | Admitting: *Deleted

## 2020-06-18 NOTE — Telephone Encounter (Signed)
Called [facility] on [address] to cancel Rx for Alprazolam because patient changed to pharmacy. I spoke to pharmacist Aurther Loft. I spoke to the patient and she states the medication is helping and she feels a difference. Dr Alvy Bimler was advised of patient's statement.

## 2020-06-26 ENCOUNTER — Telehealth: Payer: Self-pay | Admitting: General Practice

## 2020-06-26 ENCOUNTER — Telehealth: Payer: Self-pay | Admitting: Emergency Medicine

## 2020-06-26 NOTE — Telephone Encounter (Signed)
Patient still having sinus headache, pressure, and pain in cheek bones. Wants stronger antibiotic. Prescription from last visit (11/30) not working. Please advise at [phone]

## 2020-06-26 NOTE — Telephone Encounter (Signed)
Pt is still feeling pressure in the back of her head. She is wondering if you could  prescribe  her something for this issue. Please advise at [phone].

## 2020-06-27 NOTE — Telephone Encounter (Signed)
Message being handled elsewhere

## 2020-06-27 NOTE — Telephone Encounter (Signed)
Pt still having pressure in her neck should she return for re evaluation or other treatment?

## 2020-06-28 NOTE — Telephone Encounter (Signed)
Recommend to return for reevaluation.  Thanks.

## 2020-06-30 DIAGNOSIS — Z1152 Encounter for screening for COVID-19: Secondary | ICD-10-CM | POA: Diagnosis not present

## 2020-07-03 ENCOUNTER — Ambulatory Visit (INDEPENDENT_AMBULATORY_CARE_PROVIDER_SITE_OTHER): Payer: BC Managed Care – PPO | Admitting: Emergency Medicine

## 2020-07-03 ENCOUNTER — Other Ambulatory Visit: Payer: Self-pay

## 2020-07-03 ENCOUNTER — Encounter: Payer: Self-pay | Admitting: Emergency Medicine

## 2020-07-03 VITALS — BP 109/65 | HR 57 | Temp 98.3°F | Resp 16 | Ht 64.0 in | Wt 123.0 lb

## 2020-07-03 DIAGNOSIS — N1832 Chronic kidney disease, stage 3b: Secondary | ICD-10-CM | POA: Diagnosis not present

## 2020-07-03 DIAGNOSIS — R202 Paresthesia of skin: Secondary | ICD-10-CM | POA: Diagnosis not present

## 2020-07-03 DIAGNOSIS — G4452 New daily persistent headache (NDPH): Secondary | ICD-10-CM

## 2020-07-03 DIAGNOSIS — M5481 Occipital neuralgia: Secondary | ICD-10-CM

## 2020-07-03 NOTE — Progress Notes (Signed)
Danielle Ponce 24 y.o.   Chief Complaint  Patient presents with   Sinusitis    Per patient the headache tenderness, facial pain and pressure at the back of head, continuous from previous visit   Nasal Congestion    HISTORY OF PRESENT ILLNESS: This is a 24 y.o. female complaining of persistent intermittent pressure to the back of her head for the past month. It varies in intensity and sometimes associated with pain to her face in particular forehead and nose. No associated visual disturbances. No history of migraine headaches. No near syncope, lightheadedness, or dizziness episodes. Denies nausea or vomiting. Denies neck pain. Has history of chronic kidney disease. Increased amount of stress at home during the past several months due to father's illness. Denies head injury. Fully vaccinated against Covid. Denies flulike symptoms. Was treated for sinusitis once about 3 weeks ago. Anxiety was addressed with use of alprazolam as needed. No other complaints or medical concerns today.  HPI   Prior to Admission medications   Medication Sig Start Date End Date Taking? Authorizing Provider  ALPRAZolam Prudy Feeler(XANAX) 0.5 MG tablet Take 1 tablet (0.5 mg total) by mouth 2 (two) times daily as needed for anxiety. 06/17/20  Yes Ellora Varnum, Eilleen KempfMiguel Jose, MD  enalapril (VASOTEC) 10 MG tablet Take by mouth.   Yes [provider]  norethindrone-ethinyl estradiol (LOESTRIN) 1-20 MG-MCG tablet Take 1 tablet by mouth daily.   Yes [provider]  desipramine (NOPRAMIN) 10 MG tablet Take 10 mg by mouth at bedtime.  Patient not taking: Reported on 07/03/2020    [provider]  desipramine (NORPRAMIN) 25 MG tablet Take 25 mg by mouth daily.     [provider]  nitrofurantoin, macrocrystal-monohydrate, (MACROBID) 100 MG capsule Take 1 capsule (100 mg total) by mouth 2 (two) times daily. 08/07/18   Shade FloodGreene, Jeffrey R, MD    No Known Allergies  Patient Active Problem List    Diagnosis Date Noted   Chronic kidney disease 01/22/2012   Proteinuria 01/22/2012   Reflux nephropathy 01/22/2012    Past Medical History:  Diagnosis Date   Kidney disease 2001    Past Surgical History:  Procedure Laterality Date   birth mark removal  2000   TONSILLECTOMY  2004   WISDOM TOOTH EXTRACTION  2011    Social History   Socioeconomic History   Marital status: Single    Spouse name: Not on file   Number of children: Not on file   Years of education: Not on file   Highest education level: Not on file  Occupational History   Not on file  Tobacco Use   Smoking status: Never Smoker   Smokeless tobacco: Never Used  Substance and Sexual Activity   Alcohol use: No    Alcohol/week: 0.0 standard drinks   Drug use: No   Sexual activity: Yes    Partners: Male  Other Topics Concern   Not on file  Social History Narrative   Not on file   Social Determinants of Health   Financial Resource Strain: Not on file  Food Insecurity: Not on file  Transportation Needs: Not on file  Physical Activity: Not on file  Stress: Not on file  Social Connections: Not on file  Intimate Partner Violence: Not on file    Family History  Problem Relation Age of Onset   Hypertension Father    Diabetes Father      Review of Systems  Constitutional: Negative.  Negative for chills and fever.  HENT:  Negative.  Negative for congestion and sore throat.   Eyes: Negative.  Negative for blurred vision and double vision.  Respiratory: Negative.  Negative for cough and shortness of breath.   Cardiovascular: Negative.  Negative for chest pain and palpitations.  Gastrointestinal: Negative.  Negative for abdominal pain, diarrhea, nausea and vomiting.  Genitourinary: Negative.   Musculoskeletal: Negative.  Negative for back pain, myalgias and neck pain.  Skin: Negative.  Negative for rash.  Neurological: Positive for headaches. Negative for dizziness, sensory change,  speech change, focal weakness, seizures and loss of consciousness.  Psychiatric/Behavioral: The patient is nervous/anxious.   All other systems reviewed and are negative.   Today's Vitals   07/03/20 1031  BP: 109/65  Pulse: (!) 57  Resp: 16  Temp: 98.3 F (36.8 C)  TempSrc: Temporal  SpO2: 99%  Weight: 123 lb (55.8 kg)  Height: 5\' 4"  (1.626 m)   Body mass index is 21.11 kg/m.  Physical Exam Vitals reviewed.  Constitutional:      Appearance: Normal appearance.  HENT:     Head: Normocephalic.     Right Ear: Tympanic membrane, ear canal and external ear normal.     Left Ear: Tympanic membrane, ear canal and external ear normal.     Nose: Nose normal.     Mouth/Throat:     Mouth: Mucous membranes are moist.     Pharynx: Oropharynx is clear.  Eyes:     Extraocular Movements: Extraocular movements intact.     Conjunctiva/sclera: Conjunctivae normal.     Pupils: Pupils are equal, round, and reactive to light.  Neck:     Vascular: No carotid bruit.  Cardiovascular:     Rate and Rhythm: Normal rate and regular rhythm.     Pulses: Normal pulses.     Heart sounds: Normal heart sounds.  Pulmonary:     Effort: Pulmonary effort is normal.     Breath sounds: Normal breath sounds.  Musculoskeletal:        General: Normal range of motion.     Cervical back: Normal range of motion and neck supple. No tenderness.  Lymphadenopathy:     Cervical: No cervical adenopathy.  Skin:    General: Skin is warm and dry.     Capillary Refill: Capillary refill takes less than 2 seconds.  Neurological:     General: No focal deficit present.     Mental Status: She is alert and oriented to person, place, and time.     Cranial Nerves: No cranial nerve deficit.     Sensory: No sensory deficit.     Motor: No weakness.     Coordination: Coordination normal.     Gait: Gait normal.  Psychiatric:        Mood and Affect: Mood normal.        Behavior: Behavior normal.      ASSESSMENT &  PLAN: was seen today for sinusitis and nasal congestion.  Diagnoses and all orders for this visit:  New daily persistent headache -     Ambulatory referral to Neurology -     MR Brain Wo Contrast; Future  Bilateral occipital neuralgia  Paresthesia  Stage 3b chronic kidney disease (HCC)    Patient Instructions       If you have lab work done today you will be contacted with your lab results within the next 2 weeks.  If you have not heard from Danielle Ponce then please contact us. The fastest way to get your results is  to register for My Chart.   IF you received an x-ray today, you will receive an invoice from [facility]. Please contact [facility] at [phone] with questions or concerns regarding your invoice.   IF you received labwork today, you will receive an invoice from [facility]. Please contact [facility] at [phone] with questions or concerns regarding your invoice.   Our billing staff will not be able to assist you with questions regarding bills from these companies.  You will be contacted with the lab results as soon as they are available. The fastest way to get your results is to activate your My Chart account. Instructions are located on the last page of this paperwork. If you have not heard from [place] regarding the results in 2 weeks, please contact this office.     Occipital Neuralgia  Occipital neuralgia is a type of headache that causes brief episodes of very bad pain in the back of your head. Pain from occipital neuralgia may spread (radiate) to other parts of your head. These headaches may be caused by irritation of the nerves that leave your spinal cord high up in your neck, just below the base of your skull (occipital nerves). Your occipital nerves transmit sensations from the back of your head, the top of your head, and the areas behind your ears. What are the causes? This condition can occur without any known cause (primary headache  syndrome). In other cases, this condition is caused by pressure on or irritation of one of the two occipital nerves. Pressure and irritation may be due to:  Muscle spasm in the neck.  Neck injury.  Wear and tear of the vertebrae in the neck (osteoarthritis).  Disease of the disks that separate the vertebrae.  Swollen blood vessels that put pressure on the occipital nerves.  Infections.  Tumors.  Diabetes. What are the signs or symptoms? This condition causes brief burning, stabbing, electric, shocking, or shooting pain which can radiate to the top of the head. It can happen on one side or both sides of the head. It can also cause:  Pain behind the eye.  Pain triggered by neck movement or hair brushing.  Scalp tenderness.  Aching in the back of the head between episodes of very bad pain.  Pain gets worse with exposure to bright lights. How is this diagnosed? There is no test that diagnoses this condition. Your health care provider may diagnose this condition based on a physical exam and your symptoms. Other tests may be done, such as:  Imaging studies of the brain and neck (cervical spine), such as an MRI or CT scan. These look for causes of pinched nerves.  Applying pressure to the nerves in the neck to try to re-create the pain.  Injection of numbing medicine into the occipital nerve areas to see if pain goes away (diagnostic nerve block). How is this treated? Treatment for this condition may begin with simple measures, such as:  Rest.  Massage.  Applying heat or cold on the area.  Over-the-counter pain relievers. If these measures do not work, you may need other treatments, including:  Medicines, such as: ? Prescription-strength anti-inflammatory medicines. ? Muscle relaxants. ? Anti-seizure medicines, which can relieve pain. ? Antidepressants, which can relieve pain. ? Injected medicines, such as medicines that numb the area (local anesthetic) and  steroids.  Pulsed radiofrequency ablation. This is when wires are implanted to deliver electrical impulses that block pain signals from the occipital nerve.  Surgery to relieve nerve pressure.  Physical therapy. Follow these instructions at home: Pain management      Avoid any activities that cause pain.  Rest when you have an attack of pain.  Try gentle massage to relieve pain.  Try a different pillow or sleeping position.  If directed, apply heat to the affected area as told by your health care provider. Use the heat source that your health care provider recommends, such as a moist heat pack or a heating pad. ? Place a towel between your skin and the heat source. ? Leave the heat on for 20-30 minutes. ? Remove the heat if your skin turns bright red. This is especially important if you are unable to feel pain, heat, or cold. You may have a greater risk of getting burned.  If directed, apply ice to the back of the head and neck area as told by your health care provider. ? Put ice in a plastic bag. ? Place a towel between your skin and the bag. ? Leave the ice on for 20 minutes, 2-3 times per day. General instructions  Take over-the-counter and prescription medicines only as told by your health care provider.  Avoid things that make your symptoms worse, such as bright lights.  Try to stay active. Get regular exercise that does not cause pain. Ask your health care provider to suggest safe exercises for you.  Work with a physical therapist to learn stretching exercises you can do at home.  Practice good posture.  Keep all follow-up visits as told by your health care provider. This is important. Contact a health care provider if:  Your medicine is not working.  You have new or worsening symptoms. Get help right away if:  You have very bad head pain that does not go away.  You have a sudden change in vision, balance, or speech. Summary  Occipital neuralgia is a type  of headache that causes brief episodes of very bad pain in the back of your head.  Pain from occipital neuralgia may spread (radiate) to other parts of your head.  Treatment for this condition includes rest, massage, and medicines. This information is not intended to replace advice given to you by your health care provider. Make sure you discuss any questions you have with your health care provider. Document Revised: 06/21/2017 Document Reviewed: 09/09/2016 Elsevier Patient Education  2020 Elsevier Inc.      Edwina Barth, MD [facility]

## 2020-07-03 NOTE — Patient Instructions (Addendum)
If you have lab work done today you will be contacted with your lab results within the next 2 weeks.  If you have not heard from [place] then please contact us. The fastest way to get your results is to register for My Chart.   IF you received an x-ray today, you will receive an invoice from [facility]. Please contact [facility] at [phone] with questions or concerns regarding your invoice.   IF you received labwork today, you will receive an invoice from [facility]. Please contact [facility] at [phone] with questions or concerns regarding your invoice.   Our billing staff will not be able to assist you with questions regarding bills from these companies.  You will be contacted with the lab results as soon as they are available. The fastest way to get your results is to activate your My Chart account. Instructions are located on the last page of this paperwork. If you have not heard from [place] regarding the results in 2 weeks, please contact this office.     Occipital Neuralgia  Occipital neuralgia is a type of headache that causes brief episodes of very bad pain in the back of your head. Pain from occipital neuralgia may spread (radiate) to other parts of your head. These headaches may be caused by irritation of the nerves that leave your spinal cord high up in your neck, just below the base of your skull (occipital nerves). Your occipital nerves transmit sensations from the back of your head, the top of your head, and the areas behind your ears. What are the causes? This condition can occur without any known cause (primary headache syndrome). In other cases, this condition is caused by pressure on or irritation of one of the two occipital nerves. Pressure and irritation may be due to:  Muscle spasm in the neck.  Neck injury.  Wear and tear of the vertebrae in the neck (osteoarthritis).  Disease of the disks that separate the vertebrae.  Swollen blood vessels  that put pressure on the occipital nerves.  Infections.  Tumors.  Diabetes. What are the signs or symptoms? This condition causes brief burning, stabbing, electric, shocking, or shooting pain which can radiate to the top of the head. It can happen on one side or both sides of the head. It can also cause:  Pain behind the eye.  Pain triggered by neck movement or hair brushing.  Scalp tenderness.  Aching in the back of the head between episodes of very bad pain.  Pain gets worse with exposure to bright lights. How is this diagnosed? There is no test that diagnoses this condition. Your health care provider may diagnose this condition based on a physical exam and your symptoms. Other tests may be done, such as:  Imaging studies of the brain and neck (cervical spine), such as an MRI or CT scan. These look for causes of pinched nerves.  Applying pressure to the nerves in the neck to try to re-create the pain.  Injection of numbing medicine into the occipital nerve areas to see if pain goes away (diagnostic nerve block). How is this treated? Treatment for this condition may begin with simple measures, such as:  Rest.  Massage.  Applying heat or cold on the area.  Over-the-counter pain relievers. If these measures do not work, you may need other treatments, including:  Medicines, such as: ? Prescription-strength anti-inflammatory medicines. ? Muscle relaxants. ? Anti-seizure medicines, which can relieve pain. ? Antidepressants, which can relieve pain. ?  Injected medicines, such as medicines that numb the area (local anesthetic) and steroids.  Pulsed radiofrequency ablation. This is when wires are implanted to deliver electrical impulses that block pain signals from the occipital nerve.  Surgery to relieve nerve pressure.  Physical therapy. Follow these instructions at home: Pain management      Avoid any activities that cause pain.  Rest when you have an attack of  pain.  Try gentle massage to relieve pain.  Try a different pillow or sleeping position.  If directed, apply heat to the affected area as told by your health care provider. Use the heat source that your health care provider recommends, such as a moist heat pack or a heating pad. ? Place a towel between your skin and the heat source. ? Leave the heat on for 20-30 minutes. ? Remove the heat if your skin turns bright red. This is especially important if you are unable to feel pain, heat, or cold. You may have a greater risk of getting burned.  If directed, apply ice to the back of the head and neck area as told by your health care provider. ? Put ice in a plastic bag. ? Place a towel between your skin and the bag. ? Leave the ice on for 20 minutes, 2-3 times per day. General instructions  Take over-the-counter and prescription medicines only as told by your health care provider.  Avoid things that make your symptoms worse, such as bright lights.  Try to stay active. Get regular exercise that does not cause pain. Ask your health care provider to suggest safe exercises for you.  Work with a physical therapist to learn stretching exercises you can do at home.  Practice good posture.  Keep all follow-up visits as told by your health care provider. This is important. Contact a health care provider if:  Your medicine is not working.  You have new or worsening symptoms. Get help right away if:  You have very bad head pain that does not go away.  You have a sudden change in vision, balance, or speech. Summary  Occipital neuralgia is a type of headache that causes brief episodes of very bad pain in the back of your head.  Pain from occipital neuralgia may spread (radiate) to other parts of your head.  Treatment for this condition includes rest, massage, and medicines. This information is not intended to replace advice given to you by your health care provider. Make sure you discuss  any questions you have with your health care provider. Document Revised: 06/21/2017 Document Reviewed: 09/09/2016 Elsevier Patient Education  2020 [employer].

## 2020-07-07 NOTE — Telephone Encounter (Signed)
Needs to be scheduled for re evaluation of pressure in her head

## 2020-07-07 NOTE — Telephone Encounter (Signed)
Pt had an appt on 12/16 about this issue and the checkout notes stated for her to sch a f/u after MRI.

## 2020-08-04 ENCOUNTER — Other Ambulatory Visit: Payer: Self-pay

## 2020-08-04 ENCOUNTER — Telehealth (INDEPENDENT_AMBULATORY_CARE_PROVIDER_SITE_OTHER): Payer: BC Managed Care – PPO | Admitting: Registered Nurse

## 2020-08-04 ENCOUNTER — Encounter: Payer: Self-pay | Admitting: Registered Nurse

## 2020-08-04 DIAGNOSIS — R0981 Nasal congestion: Secondary | ICD-10-CM | POA: Diagnosis not present

## 2020-08-04 DIAGNOSIS — Z7689 Persons encountering health services in other specified circumstances: Secondary | ICD-10-CM

## 2020-08-04 DIAGNOSIS — N1832 Chronic kidney disease, stage 3b: Secondary | ICD-10-CM

## 2020-08-04 NOTE — Patient Instructions (Signed)
° ° ° °  If you have lab work done today you will be contacted with your lab results within the next 2 weeks.  If you have not heard from us then please contact us. The fastest way to get your results is to register for My Chart. ° ° °IF you received an x-ray today, you will receive an invoice from [facility]. Please contact [facility] at [phone] with questions or concerns regarding your invoice.  ° °IF you received labwork today, you will receive an invoice from [facility]. Please contact [facility] at [phone] with questions or concerns regarding your invoice.  ° °Our billing staff will not be able to assist you with questions regarding bills from these companies. ° °You will be contacted with the lab results as soon as they are available. The fastest way to get your results is to activate your My Chart account. Instructions are located on the last page of this paperwork. If you have not heard from us regarding the results in 2 weeks, please contact this office. °  ° ° ° °

## 2020-08-04 NOTE — Progress Notes (Signed)
Telemedicine Encounter- SOAP NOTE Established Patient  This telephone encounter was conducted with the patient's (or proxy's) verbal consent via audio telecommunications: yes  Patient was instructed to have this encounter in a suitably private space; and to only have persons present to whom they give permission to participate. In addition, patient identity was confirmed by use of name plus two identifiers (DOB and address).  I discussed the limitations, risks, security and privacy concerns of performing an evaluation and management service by telephone and the availability of in person appointments. I also discussed with the patient that there may be a patient responsible charge related to this service. The patient expressed understanding and agreed to proceed.  I spent a total of 22 minutes talking with the patient or their proxy.  Patient at home Provider in office  Chief Complaint  Patient presents with  . Establish Care    Pt transfer of care from [place] doing well overall. She is still having some sinus pressure and facial pain since back in October - November for treatment. Had Abx for about 1 week since then comes and goes, denies fever     Subjective   [name] Matusek is a 25 y.o. established patient. Telephone visit today for [facility]  HPI Formerly pt of [employer].  Hx reviewed, updated as warranted  Concern today for ongoing sinus pressure and congestion Ongoing since oct/nov, in nov had abx to help relieve symptoms, helped somewhat but never totally resolved Has not been using medication besides tylenol to help with pain. Otherwise feeling well  Patient Active Problem List   Diagnosis Date Noted  . Chronic kidney disease 01/22/2012  . Proteinuria 01/22/2012  . Reflux nephropathy 01/22/2012    Past Medical History:  Diagnosis Date  . Kidney disease 2001    Current Outpatient Medications  Medication Sig Dispense Refill  . ALPRAZolam (XANAX) 0.5 MG  tablet Take 1 tablet (0.5 mg total) by mouth 2 (two) times daily as needed for anxiety. 30 tablet 1  . azelastine (ASTELIN) 0.1 % nasal spray Place 2 sprays into both nostrils 2 (two) times daily. Use in each nostril as directed 30 mL 12  . enalapril (VASOTEC) 10 MG tablet Take by mouth.    . norethindrone-ethinyl estradiol (LOESTRIN) 1-20 MG-MCG tablet Take 1 tablet by mouth daily.     No current facility-administered medications for this visit.    No Known Allergies  Social History   Socioeconomic History  . Marital status: Significant Other    Spouse name: Not on file  . Number of children: 0  . Years of education: Not on file  . Highest education level: Not on file  Occupational History  . Occupation: Child psychotherapist  Tobacco Use  . Smoking status: Never Smoker  . Smokeless tobacco: Never Used  Vaping Use  . Vaping Use: Never used  Substance and Sexual Activity  . Alcohol use: No    Alcohol/week: 0.0 standard drinks  . Drug use: No  . Sexual activity: Yes    Partners: Male  Other Topics Concern  . Not on file  Social History Narrative  . Not on file   Social Determinants of Health   Financial Resource Strain: Not on file  Food Insecurity: Not on file  Transportation Needs: Not on file  Physical Activity: Not on file  Stress: Not on file  Social Connections: Not on file  Intimate Partner Violence: Not on file    ROS Per hpi  Objective  Vitals as reported by the patient: There were no vitals filed for this visit.  [name] was seen today for establish care.  Diagnoses and all orders for this visit:  Encounter to establish care  Nasal congestion -     azelastine (ASTELIN) 0.1 % nasal spray; Place 2 sprays into both nostrils 2 (two) times daily. Use in each nostril as directed  Stage 3b chronic kidney disease (HCC)   PLAN  Azelastine nasal spray for congestion  Suggest she present for CPE and labs  Patient encouraged to call clinic with any  questions, comments, or concerns.  I discussed the assessment and treatment plan with the patient. The patient was provided an opportunity to ask questions and all were answered. The patient agreed with the plan and demonstrated an understanding of the instructions.   The patient was advised to call back or seek an in-person evaluation if the symptoms worsen or if the condition fails to improve as anticipated.  I provided 22 minutes of non-face-to-face time during this encounter.  Janeece Agee, NP  Primary Care at [facility]

## 2020-08-05 ENCOUNTER — Telehealth: Payer: Self-pay | Admitting: Registered Nurse

## 2020-08-05 MED ORDER — AZELASTINE HCL 0.1 % NA SOLN: 2.0000 | Freq: Two times a day (BID) | NASAL | 12 refills | Status: DC

## 2020-08-05 NOTE — Telephone Encounter (Signed)
Pt calling had a virtual appt with provider yesterday stated he would send in a nasal spray. Pt pharmacy does not have that Rx. Please advise.  [id number] - [place], [place] - [address]

## 2020-08-05 NOTE — Telephone Encounter (Signed)
Sent Thanks Rich

## 2020-09-03 ENCOUNTER — Telehealth: Payer: Self-pay | Admitting: Registered Nurse

## 2020-09-03 ENCOUNTER — Encounter: Payer: Self-pay | Admitting: Registered Nurse

## 2020-09-03 NOTE — Telephone Encounter (Signed)
Pt saw Richard on 08/04/2020 and was prescribed nasal spray.  Dr Alvy Bimler 11/30 and was precribed amoxicillin-clavulanate (AUGMENTIN) 875-125 MG tablet   Pt states she has never really gotten better and over her sinus issue, and before she decides to go to ENT, she would like to try an abx again.

## 2020-09-04 NOTE — Telephone Encounter (Signed)
Patient following up on phone calls she made last week and yesterday to request a different  antibiotic before seeing an ENT specialist. She's still having issues with sinus pain and pressure.   Please advise at [phone].

## 2020-09-04 NOTE — Telephone Encounter (Signed)
Please advise on if we can send in abx or would we need to schedule this patient an office visit

## 2020-09-04 NOTE — Telephone Encounter (Signed)
Pt requesting another round Abx given sxs have not gone away. Believes this is due to the weather changing

## 2020-09-05 NOTE — Telephone Encounter (Signed)
Patient called to follow up on request for antibiotics; wants to know if appointment needed. Please advise at [phone]

## 2020-09-05 NOTE — Telephone Encounter (Signed)
Spoke with CMA Brooke. She will send direct message to provider and call patient back with an answer.

## 2020-09-05 NOTE — Telephone Encounter (Signed)
Spoke with Danielle Ponce and was going to get her a referral for ENT. And send something for the weekend relief.

## 2020-09-15 ENCOUNTER — Ambulatory Visit: Payer: BC Managed Care – PPO | Admitting: Neurology

## 2020-10-02 ENCOUNTER — Telehealth: Payer: Self-pay | Admitting: Registered Nurse

## 2020-10-02 DIAGNOSIS — N6310 Unspecified lump in the right breast, unspecified quadrant: Secondary | ICD-10-CM | POA: Diagnosis not present

## 2020-10-02 NOTE — Telephone Encounter (Signed)
Patient coming in on Monday to see provider about pain behind her ear. Patient asked if nurse would call her back to recommend a way to ease the pain/discomfort until then.   Spoke with Development worker, international aid Felicia K. She recommended alternating hot and cold compresses every fifteen minutes and taking ibuprofen as needed.

## 2020-10-06 ENCOUNTER — Ambulatory Visit: Payer: BC Managed Care – PPO | Admitting: Registered Nurse

## 2020-10-08 ENCOUNTER — Other Ambulatory Visit: Payer: Self-pay | Admitting: Obstetrics and Gynecology

## 2020-10-08 DIAGNOSIS — N6310 Unspecified lump in the right breast, unspecified quadrant: Secondary | ICD-10-CM

## 2020-10-30 ENCOUNTER — Ambulatory Visit
Admission: RE | Admit: 2020-10-30 | Discharge: 2020-10-30 | Disposition: A | Discharge status: Home / Self Care | Payer: BC Managed Care – PPO | Source: Ambulatory Visit | Attending: Obstetrics and Gynecology | Admitting: Obstetrics and Gynecology

## 2020-10-30 ENCOUNTER — Other Ambulatory Visit: Payer: Self-pay

## 2020-10-30 DIAGNOSIS — N6310 Unspecified lump in the right breast, unspecified quadrant: Secondary | ICD-10-CM

## 2020-10-30 DIAGNOSIS — N6341 Unspecified lump in right breast, subareolar: Secondary | ICD-10-CM | POA: Diagnosis not present

## 2020-12-31 DIAGNOSIS — Z760 Encounter for issue of repeat prescription: Secondary | ICD-10-CM | POA: Diagnosis not present

## 2020-12-31 DIAGNOSIS — Z01411 Encounter for gynecological examination (general) (routine) with abnormal findings: Secondary | ICD-10-CM | POA: Diagnosis not present

## 2020-12-31 DIAGNOSIS — Z124 Encounter for screening for malignant neoplasm of cervix: Secondary | ICD-10-CM | POA: Diagnosis not present

## 2020-12-31 LAB — HM PAP SMEAR: HM Pap smear: NORMAL

## 2021-01-04 DIAGNOSIS — Z23 Encounter for immunization: Secondary | ICD-10-CM | POA: Diagnosis not present

## 2021-01-04 DIAGNOSIS — S61201A Unspecified open wound of left index finger without damage to nail, initial encounter: Secondary | ICD-10-CM | POA: Diagnosis not present

## 2021-06-09 DIAGNOSIS — I129 Hypertensive chronic kidney disease with stage 1 through stage 4 chronic kidney disease, or unspecified chronic kidney disease: Secondary | ICD-10-CM | POA: Diagnosis not present

## 2021-06-09 DIAGNOSIS — R809 Proteinuria, unspecified: Secondary | ICD-10-CM | POA: Diagnosis not present

## 2021-06-09 DIAGNOSIS — N1832 Chronic kidney disease, stage 3b: Secondary | ICD-10-CM | POA: Diagnosis not present

## 2021-06-09 LAB — BASIC METABOLIC PANEL
BUN: 44 — AB (ref 4–21)
CO2: 22 (ref 13–22)
Chloride: 105 (ref 99–108)
Creatinine: 2.2 — AB (ref 0.5–1.1)
Glucose: 76
Potassium: 4.1 (ref 3.4–5.3)
Sodium: 139 (ref 137–147)

## 2021-06-09 LAB — COMPREHENSIVE METABOLIC PANEL
Albumin: 4.3 (ref 3.5–5.0)
Calcium: 10 (ref 8.7–10.7)
GFR calc non Af Amer: 32

## 2021-06-09 LAB — VITAMIN D 25 HYDROXY (VIT D DEFICIENCY, FRACTURES): Vit D, 25-Hydroxy: 84.3

## 2021-06-19 DIAGNOSIS — R809 Proteinuria, unspecified: Secondary | ICD-10-CM | POA: Diagnosis not present

## 2021-07-06 DIAGNOSIS — N1832 Chronic kidney disease, stage 3b: Secondary | ICD-10-CM | POA: Diagnosis not present

## 2021-08-11 NOTE — Progress Notes (Signed)
New Patient Office Visit  Subjective:  Patient ID: Danielle Ponce, female    DOB: 10/11/1995  Age: 26 y.o. MRN: [id number]  CC:  Chief Complaint  Patient presents with   Establish Care    NP. Est care. No main concerns.    HPI Danielle More presents for new patient visit to establish care.  Introduced to Designer, jewellery role and practice setting.  All questions answered.  Discussed provider/patient relationship and expectations.  She has no concerns today.   Depression screen [facility] 2/9 08/12/2021 08/04/2020 07/03/2020 06/10/2020 08/07/2018  Decreased Interest 0 0 0 0 0  Down, Depressed, Hopeless 0 0 0 0 0  PHQ - 2 Score 0 0 0 0 0  Altered sleeping 0 - - - -  Tired, decreased energy 0 - - - -  Change in appetite 0 - - - -  Feeling bad or failure about yourself  0 - - - -  Trouble concentrating 0 - - - -  Moving slowly or fidgety/restless 0 - - - -  Suicidal thoughts 0 - - - -  PHQ-9 Score 0 - - - -  Difficult doing work/chores Not difficult at all - - - -   GAD 7 : Generalized Anxiety Score 08/12/2021 07/03/2020 05/01/2018  Nervous, Anxious, on Edge 1 2 3   Control/stop worrying 0 1 2  Worry too much - different things 1 2 2   Trouble relaxing 0 0 2  Restless 0 0 1  Easily annoyed or irritable 0 0 1  Afraid - awful might happen 0 2 1  Total GAD 7 Score 2 7 12   Anxiety Difficulty Not difficult at all Somewhat difficult Not difficult at all    Past Medical History:  Diagnosis Date   Kidney disease 2001    Past Surgical History:  Procedure Laterality Date   birth mark removal  2000   TONSILLECTOMY  2004   WISDOM TOOTH EXTRACTION  2011    Family History  Problem Relation Age of Onset   Hypertension Father    Diabetes Father    Colon cancer Father 75       stage 4   Colon cancer Paternal Grandmother     Social History   Socioeconomic History   Marital status: Significant Other    Spouse name: Not on file   Number of children: 0   Years of education: Not  on file   Highest education level: Not on file  Occupational History   Occupation: Education officer, museum  Tobacco Use   Smoking status: Never   Smokeless tobacco: Never  Vaping Use   Vaping Use: Never used  Substance and Sexual Activity   Alcohol use: No    Alcohol/week: 0.0 standard drinks   Drug use: No   Sexual activity: Yes    Partners: Male  Other Topics Concern   Not on file  Social History Narrative   Not on file   Social Determinants of Health   Financial Resource Strain: Not on file  Food Insecurity: Not on file  Transportation Needs: Not on file  Physical Activity: Not on file  Stress: Not on file  Social Connections: Not on file  Intimate Partner Violence: Not on file    ROS Review of Systems  Constitutional: Negative.   HENT: Negative.    Eyes: Negative.   Respiratory: Negative.    Cardiovascular: Negative.   Gastrointestinal: Negative.   Endocrine: Positive for cold intolerance.  Genitourinary: Negative.   Musculoskeletal: Negative.  Skin: Negative.   Neurological: Negative.   Psychiatric/Behavioral: Negative.     Objective:   Today's Vitals: BP 117/74    Pulse 69    Temp (!) 97.3 F (36.3 C) (Temporal)    Ht 5' 4.5" (1.638 m)    Wt 126 lb 12.8 oz (57.5 kg)    LMP 08/04/2021 (Exact Date)    SpO2 97%    BMI 21.43 kg/m   Physical Exam Vitals and nursing note reviewed.  Constitutional:      General: She is not in acute distress.    Appearance: Normal appearance.  HENT:     Head: Normocephalic and atraumatic.     Right Ear: Tympanic membrane, ear canal and external ear normal.     Left Ear: Tympanic membrane, ear canal and external ear normal.     Nose: Nose normal.     Mouth/Throat:     Mouth: Mucous membranes are moist.     Pharynx: Oropharynx is clear.  Eyes:     Conjunctiva/sclera: Conjunctivae normal.  Cardiovascular:     Rate and Rhythm: Normal rate and regular rhythm.     Pulses: Normal pulses.     Heart sounds: Normal heart sounds.   Pulmonary:     Effort: Pulmonary effort is normal.     Breath sounds: Normal breath sounds.  Abdominal:     General: Bowel sounds are normal.     Palpations: Abdomen is soft.     Tenderness: There is no abdominal tenderness.  Musculoskeletal:        General: Normal range of motion.     Cervical back: Normal range of motion. No tenderness.     Comments: Strength 5/5 in bilateral upper and lower extremities   Lymphadenopathy:     Cervical: No cervical adenopathy.  Skin:    General: Skin is warm and dry.  Neurological:     General: No focal deficit present.     Mental Status: She is alert and oriented to person, place, and time.     Cranial Nerves: No cranial nerve deficit.     Coordination: Coordination normal.     Gait: Gait normal.  Psychiatric:        Mood and Affect: Mood normal.        Behavior: Behavior normal.        Thought Content: Thought content normal.        Judgment: Judgment normal.    Assessment & Plan:   Problem List Items Addressed This Visit       Genitourinary   Chronic kidney disease, stage 3b (HCC)    Chronic, stable. Follow with nephrology yearly. She states she is currently taking enalapril and just started jardiance for kidney protection. She has an appointment nephrology in 4 months as her creatinine bumped slightly when starting jardiance. Will request labs from [facility] in [place]. Continue collaboration and recommendations from nephrology.       Other Visit Diagnoses     Routine general medical examination at a health care facility    -  Primary   Health maintenance reviewed and updated. Requesting labs and records from GYN and nephrology. Check CBC, TSH today.    Relevant Orders   TSH   CBC with Differential/Platelet   Cold intolerance       States she is always cold and shivering. Will check CBC, iron panel, and TSH today.   Relevant Orders   TSH   CBC with Differential/Platelet   Iron, TIBC and Ferritin  Panel   Encounter  for hepatitis C screening test for low risk patient       Screen hepatitis C today   Relevant Orders   Hepatitis C antibody   Screening for HIV (human immunodeficiency virus)       Screen HIV today   Relevant Orders   HIV Antibody (routine testing w rflx)   Encounter to establish care           Outpatient Encounter Medications as of 08/12/2021  Medication Sig   enalapril (VASOTEC) 10 MG tablet Take by mouth.   JARDIANCE 10 MG TABS tablet Take 10 mg by mouth every morning.   norethindrone-ethinyl estradiol (LOESTRIN) 1-20 MG-MCG tablet Take 1 tablet by mouth daily.   [DISCONTINUED] ALPRAZolam (XANAX) 0.5 MG tablet Take 1 tablet (0.5 mg total) by mouth 2 (two) times daily as needed for anxiety.   [DISCONTINUED] azelastine (ASTELIN) 0.1 % nasal spray Place 2 sprays into both nostrils 2 (two) times daily. Use in each nostril as directed   No facility-administered encounter medications on file as of 08/12/2021.   LABORATORY TESTING:  - Pap smear: done elsewhere  IMMUNIZATIONS:   - Tdap: Tetanus vaccination status reviewed: last tetanus booster within 10 years. - Influenza: Up to date - Pneumovax: Not applicable - Prevnar: Not applicable - HPV: Up to date - Zostavax vaccine: Not applicable  SCREENING: -Mammogram: Not applicable  - Colonoscopy: Not applicable  - Bone Density: Not applicable  -Hearing Test: Not applicable  -Spirometry: Not applicable   PATIENT COUNSELING:   Advised to take 1 mg of folate supplement per day if capable of pregnancy.   Sexuality: Discussed sexually transmitted diseases, partner selection, use of condoms, avoidance of unintended pregnancy  and contraceptive alternatives.   Advised to avoid cigarette smoking.  I discussed with the patient that most people either abstain from alcohol or drink within safe limits (<=14/week and <=4 drinks/occasion for males, <=7/weeks and <= 3 drinks/occasion for females) and that the risk for alcohol disorders and  other health effects rises proportionally with the number of drinks per week and how often a drinker exceeds daily limits.  Discussed cessation/primary prevention of drug use and availability of treatment for abuse.   Diet: Encouraged to adjust caloric intake to maintain  or achieve ideal body weight, to reduce intake of dietary saturated fat and total fat, to limit sodium intake by avoiding high sodium foods and not adding table salt, and to maintain adequate dietary potassium and calcium preferably from fresh fruits, vegetables, and low-fat dairy products.    stressed the importance of regular exercise  Injury prevention: Discussed safety belts, safety helmets, smoke detector, smoking near bedding or upholstery.   Dental health: Discussed importance of regular tooth brushing, flossing, and dental visits.    NEXT PREVENTATIVE PHYSICAL DUE IN 1 YEAR.  Follow-up: Return in about 1 year (around 08/12/2022) for CPE.   Charyl Dancer, NP

## 2021-08-12 ENCOUNTER — Ambulatory Visit: Payer: BC Managed Care – PPO | Admitting: Nurse Practitioner

## 2021-08-12 ENCOUNTER — Encounter: Payer: Self-pay | Admitting: Nurse Practitioner

## 2021-08-12 ENCOUNTER — Other Ambulatory Visit: Payer: Self-pay

## 2021-08-12 VITALS — BP 117/74 | HR 69 | Temp 97.3°F | Ht 64.5 in | Wt 126.8 lb

## 2021-08-12 DIAGNOSIS — R6889 Other general symptoms and signs: Secondary | ICD-10-CM | POA: Diagnosis not present

## 2021-08-12 DIAGNOSIS — Z0000 Encounter for general adult medical examination without abnormal findings: Secondary | ICD-10-CM

## 2021-08-12 DIAGNOSIS — Z7689 Persons encountering health services in other specified circumstances: Secondary | ICD-10-CM

## 2021-08-12 DIAGNOSIS — N1832 Chronic kidney disease, stage 3b: Secondary | ICD-10-CM | POA: Diagnosis not present

## 2021-08-12 DIAGNOSIS — Z1159 Encounter for screening for other viral diseases: Secondary | ICD-10-CM | POA: Diagnosis not present

## 2021-08-12 DIAGNOSIS — Z114 Encounter for screening for human immunodeficiency virus [HIV]: Secondary | ICD-10-CM

## 2021-08-12 NOTE — Patient Instructions (Signed)
It was great to see you!  We will call/send your lab results through mychart once they all result (should be tomorrow).  Let's follow-up in 1 year, sooner if you have concerns.  If a referral was placed today, you will be contacted for an appointment. Please note that routine referrals can sometimes take up to 3-4 weeks to process. Please call our office if you haven't heard anything after this time frame.  Take care,  Vance Peper, NP

## 2021-08-12 NOTE — Assessment & Plan Note (Addendum)
Chronic, stable. Follow with nephrology yearly. She states she is currently taking enalapril and just started jardiance for kidney protection. She has an appointment nephrology in 4 months as her creatinine bumped slightly when starting jardiance. Will request labs from [facility] in [place]. Continue collaboration and recommendations from nephrology.

## 2021-08-13 ENCOUNTER — Encounter: Payer: Self-pay | Admitting: Nurse Practitioner

## 2021-08-13 LAB — IRON,TIBC AND FERRITIN PANEL
%SAT: 29 % (calc) (ref 16–45)
Ferritin: 35 ng/mL (ref 16–154)
Iron: 116 ug/dL (ref 40–190)
TIBC: 403 mcg/dL (calc) (ref 250–450)

## 2021-08-13 LAB — CBC WITH DIFFERENTIAL/PLATELET
Basophils Absolute: 0.1 10*3/uL (ref 0.0–0.1)
Basophils Relative: 0.6 % (ref 0.0–3.0)
Eosinophils Absolute: 0.2 10*3/uL (ref 0.0–0.7)
Eosinophils Relative: 2.1 % (ref 0.0–5.0)
HCT: 41.9 % (ref 36.0–46.0)
Hemoglobin: 13.8 g/dL (ref 12.0–15.0)
Lymphocytes Relative: 47.4 % — ABNORMAL HIGH (ref 12.0–46.0)
Lymphs Abs: 4.2 10*3/uL — ABNORMAL HIGH (ref 0.7–4.0)
MCHC: 33 g/dL (ref 30.0–36.0)
MCV: 94.1 fl (ref 78.0–100.0)
Monocytes Absolute: 0.6 10*3/uL (ref 0.1–1.0)
Monocytes Relative: 6.7 % (ref 3.0–12.0)
Neutro Abs: 3.8 10*3/uL (ref 1.4–7.7)
Neutrophils Relative %: 43.2 % (ref 43.0–77.0)
Platelets: 247 10*3/uL (ref 150.0–400.0)
RBC: 4.45 Mil/uL (ref 3.87–5.11)
RDW: 12.7 % (ref 11.5–15.5)
WBC: 8.9 10*3/uL (ref 4.0–10.5)

## 2021-08-13 LAB — HEPATITIS C ANTIBODY
Hepatitis C Ab: NONREACTIVE
SIGNAL TO CUT-OFF: 0.02 (ref <1.00)

## 2021-08-13 LAB — TSH: TSH: 2.1 u[IU]/mL (ref 0.35–5.50)

## 2021-08-13 LAB — HIV ANTIBODY (ROUTINE TESTING W REFLEX): HIV 1&2 Ab, 4th Generation: NONREACTIVE

## 2021-08-17 ENCOUNTER — Other Ambulatory Visit: Payer: Self-pay | Admitting: Nurse Practitioner

## 2021-10-09 DIAGNOSIS — J029 Acute pharyngitis, unspecified: Secondary | ICD-10-CM | POA: Diagnosis not present

## 2021-10-16 DIAGNOSIS — T783XXA Angioneurotic edema, initial encounter: Secondary | ICD-10-CM | POA: Diagnosis not present

## 2021-10-19 ENCOUNTER — Encounter: Payer: Self-pay | Admitting: Nurse Practitioner

## 2021-10-23 DIAGNOSIS — R81 Glycosuria: Secondary | ICD-10-CM | POA: Diagnosis not present

## 2021-10-23 DIAGNOSIS — M545 Low back pain, unspecified: Secondary | ICD-10-CM | POA: Diagnosis not present

## 2021-11-03 DIAGNOSIS — N1832 Chronic kidney disease, stage 3b: Secondary | ICD-10-CM | POA: Diagnosis not present

## 2021-11-11 DIAGNOSIS — T783XXA Angioneurotic edema, initial encounter: Secondary | ICD-10-CM | POA: Diagnosis not present

## 2021-11-11 DIAGNOSIS — I129 Hypertensive chronic kidney disease with stage 1 through stage 4 chronic kidney disease, or unspecified chronic kidney disease: Secondary | ICD-10-CM | POA: Diagnosis not present

## 2021-11-11 DIAGNOSIS — R809 Proteinuria, unspecified: Secondary | ICD-10-CM | POA: Diagnosis not present

## 2021-11-11 DIAGNOSIS — N1832 Chronic kidney disease, stage 3b: Secondary | ICD-10-CM | POA: Diagnosis not present

## 2021-12-31 DIAGNOSIS — Z6820 Body mass index (BMI) 20.0-20.9, adult: Secondary | ICD-10-CM | POA: Diagnosis not present

## 2021-12-31 DIAGNOSIS — Z01411 Encounter for gynecological examination (general) (routine) with abnormal findings: Secondary | ICD-10-CM | POA: Diagnosis not present

## 2021-12-31 DIAGNOSIS — Z760 Encounter for issue of repeat prescription: Secondary | ICD-10-CM | POA: Diagnosis not present

## 2022-03-08 DIAGNOSIS — N1832 Chronic kidney disease, stage 3b: Secondary | ICD-10-CM | POA: Diagnosis not present

## 2022-06-08 DIAGNOSIS — N1832 Chronic kidney disease, stage 3b: Secondary | ICD-10-CM | POA: Diagnosis not present

## 2022-06-08 DIAGNOSIS — T783XXA Angioneurotic edema, initial encounter: Secondary | ICD-10-CM | POA: Diagnosis not present

## 2022-06-08 DIAGNOSIS — R809 Proteinuria, unspecified: Secondary | ICD-10-CM | POA: Diagnosis not present

## 2022-06-08 DIAGNOSIS — I129 Hypertensive chronic kidney disease with stage 1 through stage 4 chronic kidney disease, or unspecified chronic kidney disease: Secondary | ICD-10-CM | POA: Diagnosis not present

## 2022-06-17 ENCOUNTER — Other Ambulatory Visit: Payer: Self-pay | Admitting: Internal Medicine

## 2022-06-17 DIAGNOSIS — R109 Unspecified abdominal pain: Secondary | ICD-10-CM

## 2022-06-28 ENCOUNTER — Ambulatory Visit
Admission: RE | Admit: 2022-06-28 | Discharge: 2022-06-28 | Disposition: A | Discharge status: Home / Self Care | Payer: BC Managed Care – PPO | Source: Ambulatory Visit | Attending: Internal Medicine | Admitting: Internal Medicine

## 2022-06-28 DIAGNOSIS — N261 Atrophy of kidney (terminal): Secondary | ICD-10-CM | POA: Diagnosis not present

## 2022-06-28 DIAGNOSIS — R109 Unspecified abdominal pain: Secondary | ICD-10-CM

## 2022-06-28 DIAGNOSIS — N289 Disorder of kidney and ureter, unspecified: Secondary | ICD-10-CM | POA: Diagnosis not present

## 2022-07-26 DIAGNOSIS — N1832 Chronic kidney disease, stage 3b: Secondary | ICD-10-CM | POA: Diagnosis not present

## 2022-08-05 DIAGNOSIS — N1832 Chronic kidney disease, stage 3b: Secondary | ICD-10-CM | POA: Diagnosis not present

## 2022-08-05 DIAGNOSIS — I129 Hypertensive chronic kidney disease with stage 1 through stage 4 chronic kidney disease, or unspecified chronic kidney disease: Secondary | ICD-10-CM | POA: Diagnosis not present

## 2022-08-05 DIAGNOSIS — T783XXA Angioneurotic edema, initial encounter: Secondary | ICD-10-CM | POA: Diagnosis not present

## 2022-08-05 DIAGNOSIS — R809 Proteinuria, unspecified: Secondary | ICD-10-CM | POA: Diagnosis not present

## 2022-08-13 NOTE — Progress Notes (Unsigned)
There were no vitals taken for this visit.   Subjective:    Patient ID: Danielle Ponce, female    DOB: 12-10-1995, 27 y.o.   MRN: [id number]  CC: No chief complaint on file.   HPI: Danielle Benninger is a 27 y.o. female presenting on 08/16/2022 for comprehensive medical examination. Current medical complaints include:{Blank single:19197::"none","***"}  She currently lives with: Menopausal Symptoms: {Blank single:19197::"yes","no"}  Depression Screen done today and results listed below:     08/12/2021    3:43 PM 08/04/2020    8:36 AM 07/03/2020   10:34 AM 06/10/2020    3:32 PM 08/07/2018   10:23 AM  Depression screen PHQ 2/9  Decreased Interest 0 0 0 0 0  Down, Depressed, Hopeless 0 0 0 0 0  PHQ - 2 Score 0 0 0 0 0  Altered sleeping 0      Tired, decreased energy 0      Change in appetite 0      Feeling bad or failure about yourself  0      Trouble concentrating 0      Moving slowly or fidgety/restless 0      Suicidal thoughts 0      PHQ-9 Score 0      Difficult doing work/chores Not difficult at all        The patient {has/does not QJJH:41740} a history of falls. I {did/did not:19850} complete a risk assessment for falls. A plan of care for falls {was/was not:19852} documented.   Past Medical History:  Past Medical History:  Diagnosis Date   Kidney disease 2001    Surgical History:  Past Surgical History:  Procedure Laterality Date   birth mark removal  2000   TONSILLECTOMY  2004   WISDOM TOOTH EXTRACTION  2011    Medications:  Current Outpatient Medications on File Prior to Visit  Medication Sig   enalapril (VASOTEC) 10 MG tablet Take by mouth.   JARDIANCE 10 MG TABS tablet Take 10 mg by mouth every morning.   norethindrone-ethinyl estradiol (LOESTRIN) 1-20 MG-MCG tablet Take 1 tablet by mouth daily.   No current facility-administered medications on file prior to visit.    Allergies:  No Known Allergies  Social History:  Social History    Socioeconomic History   Marital status: Significant Other    Spouse name: Not on file   Number of children: 0   Years of education: Not on file   Highest education level: Not on file  Occupational History   Occupation: Education officer, museum  Tobacco Use   Smoking status: Never   Smokeless tobacco: Never  Vaping Use   Vaping Use: Never used  Substance and Sexual Activity   Alcohol use: No    Alcohol/week: 0.0 standard drinks of alcohol   Drug use: No   Sexual activity: Yes    Partners: Male  Other Topics Concern   Not on file  Social History Narrative   Not on file   Social Determinants of Health   Financial Resource Strain: Not on file  Food Insecurity: Not on file  Transportation Needs: Not on file  Physical Activity: Not on file  Stress: Not on file  Social Connections: Not on file  Intimate Partner Violence: Not on file   Social History   Tobacco Use  Smoking Status Never  Smokeless Tobacco Never   Social History   Substance and Sexual Activity  Alcohol Use No   Alcohol/week: 0.0 standard drinks of alcohol    Family  History:  Family History  Problem Relation Age of Onset   Hypertension Father    Diabetes Father    Colon cancer Father 63       stage 90   Colon cancer Paternal Grandmother     Past medical history, surgical history, medications, allergies, family history and social history reviewed with patient today and changes made to appropriate areas of the chart.   ROS All other ROS negative except what is listed above and in the HPI.      Objective:    There were no vitals taken for this visit.  Wt Readings from Last 3 Encounters:  08/12/21 126 lb 12.8 oz (57.5 kg)  07/03/20 123 lb (55.8 kg)  06/17/20 122 lb 3.2 oz (55.4 kg)    Physical Exam  Results for orders placed or performed in visit on 08/17/21  VITAMIN D 25 Hydroxy (Vit-D Deficiency, Fractures)  Result Value Ref Range   Vit D, 25-Hydroxy 63.8   Basic metabolic panel  Result Value  Ref Range   Glucose 76    BUN 44 (A) 4 - 21   CO2 22 13 - 22   Creatinine 2.2 (A) 0.5 - 1.1   Potassium 4.1 3.4 - 5.3   Sodium 139 137 - 147   Chloride 105 99 - 108  Comprehensive metabolic panel  Result Value Ref Range   GFR calc non Af Amer 32    Calcium 10.0 8.7 - 10.7   Albumin 4.3 3.5 - 5.0      Assessment & Plan:   Problem List Items Addressed This Visit   None    Follow up plan: No follow-ups on file.   LABORATORY TESTING:  - Pap smear: up to date  IMMUNIZATIONS:   - Tdap: Tetanus vaccination status reviewed: last tetanus booster within 10 years. - Influenza: {Blank single:19197::"Up to date","Administered today","Postponed to flu season","Refused","Given elsewhere"} - Pneumovax: Not applicable - Prevnar: Not applicable - HPV: Up to date - Zostavax vaccine: Not applicable  SCREENING: -Mammogram: Not applicable  - Colonoscopy: Not applicable  - Bone Density: Not applicable  -Hearing Test: Not applicable  -Spirometry: Not applicable   PATIENT COUNSELING:   Advised to take 1 mg of folate supplement per day if capable of pregnancy.   Sexuality: Discussed sexually transmitted diseases, partner selection, use of condoms, avoidance of unintended pregnancy  and contraceptive alternatives.   Advised to avoid cigarette smoking.  I discussed with the patient that most people either abstain from alcohol or drink within safe limits (<=14/week and <=4 drinks/occasion for males, <=7/weeks and <= 3 drinks/occasion for females) and that the risk for alcohol disorders and other health effects rises proportionally with the number of drinks per week and how often a drinker exceeds daily limits.  Discussed cessation/primary prevention of drug use and availability of treatment for abuse.   Diet: Encouraged to adjust caloric intake to maintain  or achieve ideal body weight, to reduce intake of dietary saturated fat and total fat, to limit sodium intake by avoiding high sodium  foods and not adding table salt, and to maintain adequate dietary potassium and calcium preferably from fresh fruits, vegetables, and low-fat dairy products.    stressed the importance of regular exercise  Injury prevention: Discussed safety belts, safety helmets, smoke detector, smoking near bedding or upholstery.   Dental health: Discussed importance of regular tooth brushing, flossing, and dental visits.    NEXT PREVENTATIVE PHYSICAL DUE IN 1 YEAR. No follow-ups on file.

## 2022-08-16 ENCOUNTER — Ambulatory Visit (INDEPENDENT_AMBULATORY_CARE_PROVIDER_SITE_OTHER): Payer: BC Managed Care – PPO | Admitting: Nurse Practitioner

## 2022-08-16 ENCOUNTER — Encounter: Payer: Self-pay | Admitting: Nurse Practitioner

## 2022-08-16 VITALS — BP 118/82 | HR 76 | Temp 96.7°F | Ht 64.5 in | Wt 121.2 lb

## 2022-08-16 DIAGNOSIS — N1832 Chronic kidney disease, stage 3b: Secondary | ICD-10-CM

## 2022-08-16 DIAGNOSIS — Z0000 Encounter for general adult medical examination without abnormal findings: Secondary | ICD-10-CM

## 2022-08-16 LAB — COMPREHENSIVE METABOLIC PANEL
ALT: 17 U/L (ref 0–35)
AST: 18 U/L (ref 0–37)
Albumin: 4.2 g/dL (ref 3.5–5.2)
Alkaline Phosphatase: 72 U/L (ref 39–117)
BUN: 39 mg/dL — ABNORMAL HIGH (ref 6–23)
CO2: 26 mEq/L (ref 19–32)
Calcium: 10.1 mg/dL (ref 8.4–10.5)
Chloride: 105 mEq/L (ref 96–112)
Creatinine, Ser: 2.23 mg/dL — ABNORMAL HIGH (ref 0.40–1.20)
GFR: 29.25 mL/min — ABNORMAL LOW (ref >60.00)
Glucose, Bld: 80 mg/dL (ref 70–99)
Potassium: 4.5 mEq/L (ref 3.5–5.1)
Sodium: 143 mEq/L (ref 135–145)
Total Bilirubin: 0.5 mg/dL (ref 0.2–1.2)
Total Protein: 6.8 g/dL (ref 6.0–8.3)

## 2022-08-16 LAB — CBC WITH DIFFERENTIAL/PLATELET
Basophils Absolute: 0 10*3/uL (ref 0.0–0.1)
Basophils Relative: 0.4 % (ref 0.0–3.0)
Eosinophils Absolute: 0.1 10*3/uL (ref 0.0–0.7)
Eosinophils Relative: 1.3 % (ref 0.0–5.0)
HCT: 40.7 % (ref 36.0–46.0)
Hemoglobin: 13.8 g/dL (ref 12.0–15.0)
Lymphocytes Relative: 36.5 % (ref 12.0–46.0)
Lymphs Abs: 2.8 10*3/uL (ref 0.7–4.0)
MCHC: 33.8 g/dL (ref 30.0–36.0)
MCV: 95.2 fl (ref 78.0–100.0)
Monocytes Absolute: 0.5 10*3/uL (ref 0.1–1.0)
Monocytes Relative: 6 % (ref 3.0–12.0)
Neutro Abs: 4.3 10*3/uL (ref 1.4–7.7)
Neutrophils Relative %: 55.8 % (ref 43.0–77.0)
Platelets: 202 10*3/uL (ref 150.0–400.0)
RBC: 4.28 Mil/uL (ref 3.87–5.11)
RDW: 13 % (ref 11.5–15.5)
WBC: 7.8 10*3/uL (ref 4.0–10.5)

## 2022-08-16 LAB — TSH: TSH: 2.48 u[IU]/mL (ref 0.35–5.50)

## 2022-08-16 NOTE — Patient Instructions (Signed)
It was great to see you!  We are checking your labs today and will let you know the results via mychart/phone.   Let's follow-up in 1 year, sooner if you have concerns.  If a referral was placed today, you will be contacted for an appointment. Please note that routine referrals can sometimes take up to 3-4 weeks to process. Please call our office if you haven't heard anything after this time frame.  Take care,  Amandajo Gonder, NP  

## 2022-08-16 NOTE — Assessment & Plan Note (Signed)
Chronic, stable. 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 She is also taking jardiance 25mg  daily. She is following with nephrology. Continue collaboration and recommendations from nephrology.

## 2022-09-06 DIAGNOSIS — N1832 Chronic kidney disease, stage 3b: Secondary | ICD-10-CM | POA: Diagnosis not present

## 2022-09-16 DIAGNOSIS — N1832 Chronic kidney disease, stage 3b: Secondary | ICD-10-CM | POA: Diagnosis not present

## 2022-09-16 DIAGNOSIS — R800 Isolated proteinuria: Secondary | ICD-10-CM | POA: Diagnosis not present

## 2022-09-16 DIAGNOSIS — N261 Atrophy of kidney (terminal): Secondary | ICD-10-CM | POA: Diagnosis not present

## 2022-09-24 DIAGNOSIS — N1832 Chronic kidney disease, stage 3b: Secondary | ICD-10-CM | POA: Diagnosis not present

## 2022-09-24 DIAGNOSIS — R800 Isolated proteinuria: Secondary | ICD-10-CM | POA: Diagnosis not present

## 2022-12-01 DIAGNOSIS — N1832 Chronic kidney disease, stage 3b: Secondary | ICD-10-CM | POA: Diagnosis not present

## 2022-12-01 DIAGNOSIS — N261 Atrophy of kidney (terminal): Secondary | ICD-10-CM | POA: Diagnosis not present

## 2022-12-09 DIAGNOSIS — N1832 Chronic kidney disease, stage 3b: Secondary | ICD-10-CM | POA: Diagnosis not present

## 2022-12-09 DIAGNOSIS — N261 Atrophy of kidney (terminal): Secondary | ICD-10-CM | POA: Diagnosis not present

## 2022-12-30 IMAGING — US US BREAST*R* LIMITED INC AXILLA
1 series · 4 of 4 positions shown · non-contrast
Comparison: None.

CLINICAL DATA: 24-year-old female with palpable lumps in the medial
right breast.

EXAM:
ULTRASOUND OF THE RIGHT BREAST

[Series 1: us breast*right* limited inc axilla · 0.06mm/px · 4 of 4 slices shown]
[im 1/4]
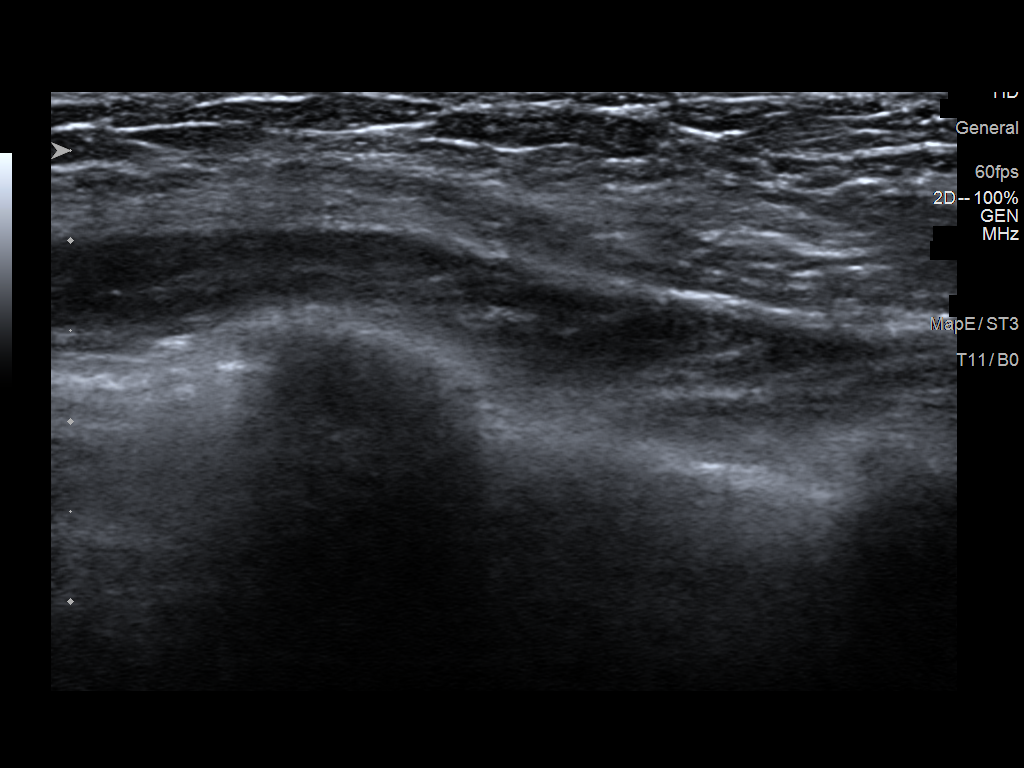
[im 2/4]
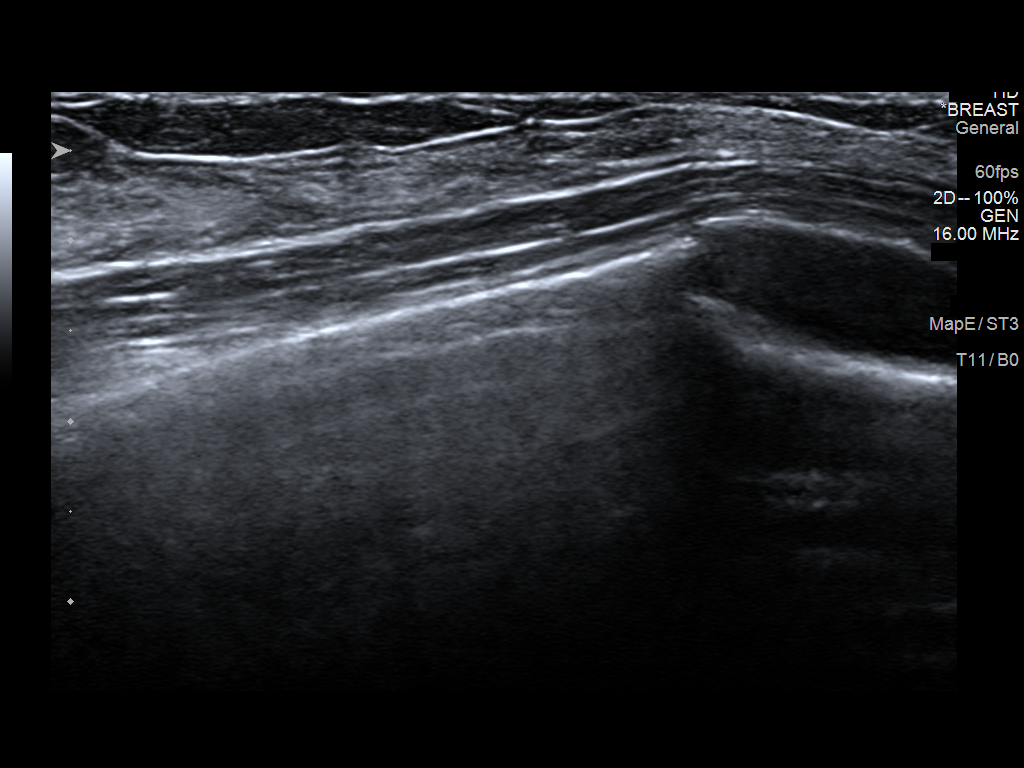
[im 3/4]
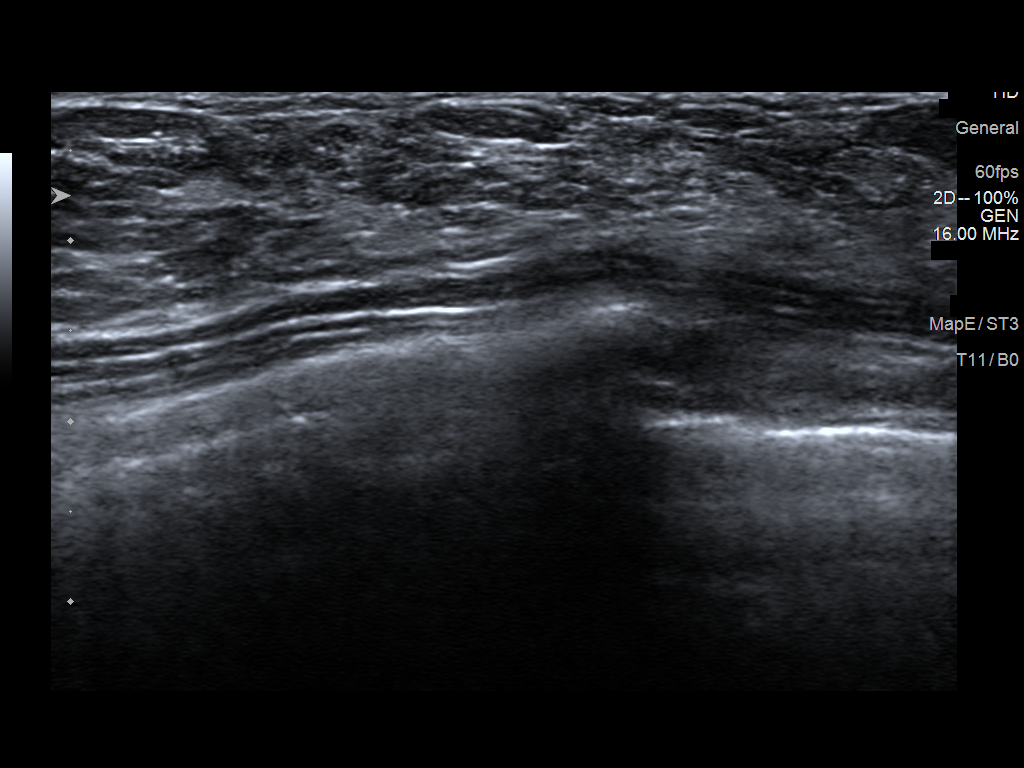
[im 4/4]
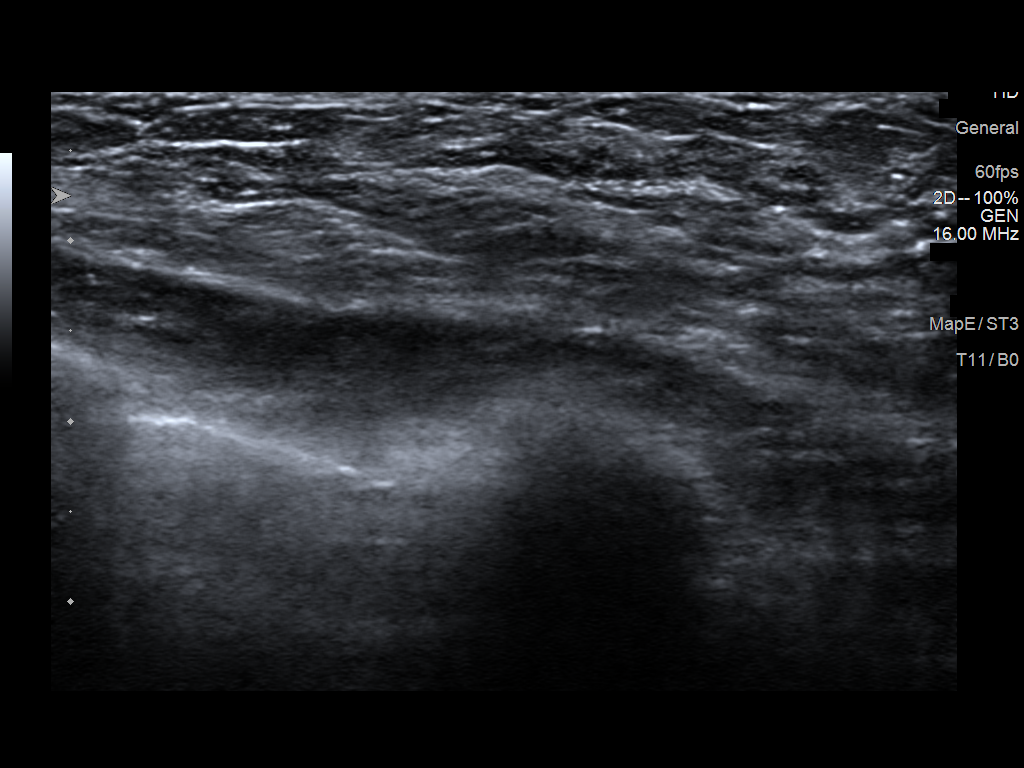

[4 of 4 positions shown; findings below may reference images not displayed]

FINDINGS: On physical exam, I palpate firm lumps in the medial right breast
consistent with the patient's anterior ribs. No other suspicious
lumps identified.

Targeted ultrasound is performed, showing normal fibroglandular
tissue without focal or suspicious sonographic abnormality. Note is
made of the underlying ribs at the site of the patient's focal
lumps.
IMPRESSION: Unremarkable ultrasound evaluation of the right breast.

RECOMMENDATION:
1. Clinical follow-up recommended for the palpable area of concern
in the right breast. Any further workup should be based on clinical
grounds.
2. Screening mammogram at age 38 unless there are persistent or
intervening clinical concerns. (Code:U5-N-UMY)

I have discussed the findings and recommendations with the patient.
If applicable, a reminder letter will be sent to the patient
regarding the next appointment.

BI-RADS CATEGORY  1: Negative.

## 2023-01-03 DIAGNOSIS — Z1331 Encounter for screening for depression: Secondary | ICD-10-CM | POA: Diagnosis not present

## 2023-01-03 DIAGNOSIS — Z01419 Encounter for gynecological examination (general) (routine) without abnormal findings: Secondary | ICD-10-CM | POA: Diagnosis not present

## 2023-01-03 DIAGNOSIS — Z3040 Encounter for surveillance of contraceptives, unspecified: Secondary | ICD-10-CM | POA: Diagnosis not present

## 2023-01-03 DIAGNOSIS — Z124 Encounter for screening for malignant neoplasm of cervix: Secondary | ICD-10-CM | POA: Diagnosis not present

## 2023-03-17 DIAGNOSIS — D225 Melanocytic nevi of trunk: Secondary | ICD-10-CM | POA: Diagnosis not present

## 2023-03-17 DIAGNOSIS — D2271 Melanocytic nevi of right lower limb, including hip: Secondary | ICD-10-CM | POA: Diagnosis not present

## 2023-03-17 DIAGNOSIS — L821 Other seborrheic keratosis: Secondary | ICD-10-CM | POA: Diagnosis not present

## 2023-03-17 DIAGNOSIS — L814 Other melanin hyperpigmentation: Secondary | ICD-10-CM | POA: Diagnosis not present

## 2023-05-11 DIAGNOSIS — D225 Melanocytic nevi of trunk: Secondary | ICD-10-CM | POA: Diagnosis not present

## 2023-05-27 DIAGNOSIS — D225 Melanocytic nevi of trunk: Secondary | ICD-10-CM | POA: Diagnosis not present

## 2023-05-27 DIAGNOSIS — D485 Neoplasm of uncertain behavior of skin: Secondary | ICD-10-CM | POA: Diagnosis not present

## 2023-10-17 ENCOUNTER — Encounter: Payer: Self-pay | Admitting: Nurse Practitioner
